# Patient Record
Sex: Female | Born: 1980
Health system: Southern US, Community
[De-identification: ages and names within clinical notes are randomized; demographics above are authoritative.]

## PROBLEM LIST (undated history)

## (undated) DIAGNOSIS — I10 Essential (primary) hypertension: Secondary | ICD-10-CM

---

## 2006-01-24 ENCOUNTER — Emergency Department: Payer: Self-pay

## 2015-07-08 ENCOUNTER — Emergency Department (HOSPITAL_BASED_OUTPATIENT_CLINIC_OR_DEPARTMENT_OTHER)
Admission: EM | Admit: 2015-07-08 | Discharge: 2015-07-08 | Disposition: A | Discharge status: Home / Self Care | Payer: Medicaid Other | Attending: Emergency Medicine | Admitting: Emergency Medicine

## 2015-07-08 ENCOUNTER — Emergency Department (HOSPITAL_BASED_OUTPATIENT_CLINIC_OR_DEPARTMENT_OTHER): Payer: Medicaid Other

## 2015-07-08 ENCOUNTER — Encounter (HOSPITAL_BASED_OUTPATIENT_CLINIC_OR_DEPARTMENT_OTHER): Payer: Self-pay

## 2015-07-08 DIAGNOSIS — N83201 Unspecified ovarian cyst, right side: Secondary | ICD-10-CM | POA: Diagnosis not present

## 2015-07-08 DIAGNOSIS — N390 Urinary tract infection, site not specified: Secondary | ICD-10-CM | POA: Diagnosis not present

## 2015-07-08 DIAGNOSIS — Z3202 Encounter for pregnancy test, result negative: Secondary | ICD-10-CM | POA: Diagnosis not present

## 2015-07-08 DIAGNOSIS — R42 Dizziness and giddiness: Secondary | ICD-10-CM | POA: Insufficient documentation

## 2015-07-08 DIAGNOSIS — R109 Unspecified abdominal pain: Secondary | ICD-10-CM

## 2015-07-08 DIAGNOSIS — R10A2 Flank pain, left side: Secondary | ICD-10-CM

## 2015-07-08 LAB — URINALYSIS, ROUTINE W REFLEX MICROSCOPIC
BILIRUBIN URINE: NEGATIVE
Glucose, UA: NEGATIVE mg/dL
Ketones, ur: NEGATIVE mg/dL
NITRITE: POSITIVE — AB
PROTEIN: NEGATIVE mg/dL
SPECIFIC GRAVITY, URINE: 1.028 (ref 1.005–1.030)
pH: 6.5 (ref 5.0–8.0)

## 2015-07-08 LAB — PREGNANCY, URINE: PREG TEST UR: NEGATIVE

## 2015-07-08 LAB — URINE MICROSCOPIC-ADD ON

## 2015-07-08 MED ORDER — CEPHALEXIN 500 MG PO CAPS: 500.0000 mg | ORAL_CAPSULE | Freq: Four times a day (QID) | ORAL | Status: DC

## 2015-07-08 MED ORDER — TRAMADOL HCL 50 MG PO TABS: 50.0000 mg | ORAL_TABLET | Freq: Four times a day (QID) | ORAL | Status: DC | PRN

## 2015-07-08 NOTE — ED Provider Notes (Signed)
CSN: [id number]     Arrival date & time 07/08/15  1643 History  By signing my name below, I, Budd Palmer, attest that this documentation has been prepared under the direction and in the presence of Geoffery Lyons, MD. Electronically Signed: Budd Palmer, ED Scribe. 07/08/2015. 5:33 PM.    Chief Complaint  Patient presents with  . Flank Pain   Patient is a 35 y.o. female presenting with flank pain. The history is provided by the patient. No language interpreter was used.  Flank Pain This is a new problem. The current episode started more than 2 days ago. The problem occurs constantly. The problem has been gradually worsening. The symptoms are aggravated by bending and twisting. The symptoms are relieved by rest.   HPI Comments: Cassidy Torres is a 35 y.o. female who presents to the Emergency Department complaining of constant, aching, worsening, left flank pain onset 4 days ago. She notes exacerbation of the pain to the point of lightheadedness when standing up, as well as exacerbation with movement, palpation, and twisting. She reports a PMHx of UTI, but notes that this felt different from her current symptoms. She reports her period is irregular, as she has an IUD. Pt denies dysuria, difficulty urinating, hematuria, fever, and loss of appetite.   History reviewed. No pertinent past medical history. History reviewed. No pertinent past surgical history. No family history on file. Social History  Substance Use Topics  . Smoking status: Never Smoker   . Smokeless tobacco: None  . Alcohol Use: No   OB History    No data available     Review of Systems  Constitutional: Negative for fever and appetite change.  Genitourinary: Positive for flank pain. Negative for dysuria, hematuria and difficulty urinating.  Neurological: Positive for light-headedness.  All other systems reviewed and are negative.   Allergies  Review of patient's allergies indicates no known allergies.  Home  Medications   Prior to Admission medications   Not on File   BP 132/93 mmHg  Pulse 96  Temp(Src) 98.4 F (36.9 C) (Oral)  Resp 18  Ht 4\' 11"  (1.499 m)  Wt 115 lb (52.164 kg)  BMI 23.21 kg/m2  SpO2 97% Physical Exam  Constitutional: She is oriented to person, place, and time. She appears well-developed and well-nourished.  HENT:  Head: Normocephalic and atraumatic.  Eyes: Conjunctivae are normal. Right eye exhibits no discharge. Left eye exhibits no discharge.  Pulmonary/Chest: Effort normal. No respiratory distress.  Musculoskeletal: She exhibits tenderness.  Mild TTP in the L flank  Neurological: She is alert and oriented to person, place, and time. Coordination normal.  Skin: Skin is warm and dry. No rash noted. She is not diaphoretic. No erythema.  Psychiatric: She has a normal mood and affect.  Nursing note and vitals reviewed.   ED Course  Procedures  DIAGNOSTIC STUDIES: Oxygen Saturation is 97% on RA, adequate by my interpretation.    COORDINATION OF CARE: 5:31 PM - Discussed lab results of mild infection and possible musculoskeletal origin of the pain. Discussed plans to order a CAT scan. Pt advised of plan for treatment and pt agrees.  Labs Review Labs Reviewed  URINALYSIS, ROUTINE W REFLEX MICROSCOPIC (NOT AT [facility]) - Abnormal; Notable for the following:    APPearance CLOUDY (*)    Hgb urine dipstick SMALL (*)    Nitrite POSITIVE (*)    Leukocytes, UA SMALL (*)    All other components within normal limits  URINE MICROSCOPIC-ADD ON - Abnormal; Notable  for the following:    Squamous Epithelial / LPF 6-30 (*)    Bacteria, UA MANY (*)    All other components within normal limits  PREGNANCY, URINE    Imaging Review Ct Renal Stone Study  07/08/2015  CLINICAL DATA:  35 year old female with left-sided flank pain for the past 4 days. No associated nausea, vomiting or diarrhea. EXAM: CT ABDOMEN AND PELVIS WITHOUT CONTRAST TECHNIQUE: Multidetector CT imaging of the  abdomen and pelvis was performed following the standard protocol without IV contrast. COMPARISON:  No priors. FINDINGS: Lower chest:  Unremarkable. Hepatobiliary: The definite cystic or solid hepatic lesions are identified on today's noncontrast CT examination. Unenhanced appearance of the gallbladder is normal. Pancreas: No definite pancreatic mass or peripancreatic inflammatory changes on today's noncontrast CT examination. Spleen: Unremarkable. Adrenals/Urinary Tract: There are no abnormal calcifications within the collecting system of either kidney, along the course of either ureter, or within the lumen of the urinary bladder. No hydroureteronephrosis or perinephric stranding to suggest urinary tract obstruction at this time. The unenhanced appearance of the kidneys is unremarkable bilaterally. Unenhanced appearance of the urinary bladder is normal. Bilateral adrenal glands are normal in appearance. Stomach/Bowel: Unenhanced appearance of the stomach is normal. No pathologic dilatation of small bowel or colon. Normal appendix. Vascular/Lymphatic: No significant atherosclerotic disease or definite aneurysm identified in the abdominal or pelvic vasculature. No lymphadenopathy noted in the abdomen or pelvis on today's noncontrast CT examination. Reproductive: IUD present in the uterus appears properly located. Left ovary is unremarkable in appearance. In the right ovary there is a well-circumscribed 4.5 x 4.2 x 5.1 cm low-attenuation lesion which is favored to represent a cyst, but is incompletely characterized on today's noncontrast CT examination. Other: Trace volume of free fluid in the cul-de-sac, likely physiologic in this young female patient. No pneumoperitoneum. Musculoskeletal: There are no aggressive appearing lytic or blastic lesions noted in the visualized portions of the skeleton. IMPRESSION: 1. No acute findings in the abdomen or pelvis to account for the patient's symptoms. Specifically, no urinary  tract calculi no findings of urinary tract obstruction are noted at this time. 2. 4.5 x 4.2 x 5.1 cm low-attenuation right ovarian lesion likely to represent a cyst. Follow-up pelvic ultrasound in 6-12 weeks is recommended to ensure stability or resolution. This recommendation follows ACR consensus guidelines: White Paper of the ACR Incidental Findings Committee II on Adnexal Findings. J Am Coll Radiol [id number]. 3. Normal appendix. 4. Additional incidental findings, as above. Electronically Signed   By: Trudie Reedaniel  Entrikin M.D.   On: 07/08/2015 18:34   I have personally reviewed and evaluated these images and lab results as part of my medical decision-making.   EKG Interpretation None      MDM   Final diagnoses:  Left flank pain    Patient presents here with complaints of left flank pain for the past several days. Her exam is most consistent with a musculoskeletal etiology, however her urinalysis does reveal slight blood and evidence for a slight infection. To rule out kidney stone, a renal CT was performed. This was negative for calculus, however did reveal an incidental right sided ovarian cyst. She will be treated with antibiotics, pain medication, and follow-up with her GYN if not improving.  I personally performed the services described in this documentation, which was scribed in my presence. The recorded information has been reviewed and is accurate.       Geoffery Lyonsouglas Evalena Fujii, MD 07/08/15 [phone]

## 2015-07-08 NOTE — Discharge Instructions (Signed)
Keflex as prescribed.   Tramadol as prescribed as needed for pain.  Follow-up with your Gynecologist in 3 months for a repeat ultrasound of your pelvis.   Flank Pain Flank pain refers to pain that is located on the side of the body between the upper abdomen and the back. The pain may occur over a short period of time (acute) or may be long-term or reoccurring (chronic). It may be mild or severe. Flank pain can be caused by many things. CAUSES  Some of the more common causes of flank pain include:  Muscle strains.   Muscle spasms.   A disease of your spine (vertebral disk disease).   A lung infection (pneumonia).   Fluid around your lungs (pulmonary edema).   A kidney infection.   Kidney stones.   A very painful skin rash caused by the chickenpox virus (shingles).   Gallbladder disease.  HOME CARE INSTRUCTIONS  Home care will depend on the cause of your pain. In general,  Rest as directed by your caregiver.  Drink enough fluids to keep your urine clear or pale yellow.  Only take over-the-counter or prescription medicines as directed by your caregiver. Some medicines may help relieve the pain.  Tell your caregiver about any changes in your pain.  Follow up with your caregiver as directed. SEEK IMMEDIATE MEDICAL CARE IF:   Your pain is not controlled with medicine.   You have new or worsening symptoms.  Your pain increases.   You have abdominal pain.   You have shortness of breath.   You have persistent nausea or vomiting.   You have swelling in your abdomen.   You feel faint or pass out.   You have blood in your urine.  You have a fever or persistent symptoms for more than 2-3 days.  You have a fever and your symptoms suddenly get worse. MAKE SURE YOU:   Understand these instructions.  Will watch your condition.  Will get help right away if you are not doing well or get worse.   This information is not intended to replace advice  given to you by your health care provider. Make sure you discuss any questions you have with your health care provider.   Document Released: 08/06/2005 Document Revised: 03/09/2012 Document Reviewed: 01/28/2012 Elsevier Interactive Patient Education [employer]2016 Elsevier Inc.

## 2015-07-08 NOTE — ED Notes (Addendum)
Left flank pain x 4 days-denies injury-pain is worse with movement

## 2015-07-31 ENCOUNTER — Emergency Department (HOSPITAL_BASED_OUTPATIENT_CLINIC_OR_DEPARTMENT_OTHER)
Admission: EM | Admit: 2015-07-31 | Discharge: 2015-07-31 | Disposition: A | Discharge status: Home / Self Care | Payer: Medicaid Other | Attending: Emergency Medicine | Admitting: Emergency Medicine

## 2015-07-31 ENCOUNTER — Encounter (HOSPITAL_BASED_OUTPATIENT_CLINIC_OR_DEPARTMENT_OTHER): Payer: Self-pay | Admitting: *Deleted

## 2015-07-31 ENCOUNTER — Emergency Department (HOSPITAL_BASED_OUTPATIENT_CLINIC_OR_DEPARTMENT_OTHER): Payer: Medicaid Other

## 2015-07-31 DIAGNOSIS — Z8669 Personal history of other diseases of the nervous system and sense organs: Secondary | ICD-10-CM | POA: Diagnosis not present

## 2015-07-31 DIAGNOSIS — R202 Paresthesia of skin: Secondary | ICD-10-CM | POA: Diagnosis not present

## 2015-07-31 DIAGNOSIS — R200 Anesthesia of skin: Secondary | ICD-10-CM | POA: Diagnosis not present

## 2015-07-31 DIAGNOSIS — M25522 Pain in left elbow: Secondary | ICD-10-CM | POA: Diagnosis present

## 2015-07-31 MED ORDER — IBUPROFEN 400 MG PO TABS
600.0000 mg | ORAL_TABLET | Freq: Once | ORAL | Status: AC
  Administered 2015-07-31: 600 mg via ORAL
  Filled 2015-07-31: qty 1

## 2015-07-31 MED ORDER — IBUPROFEN 600 MG PO TABS: 600.0000 mg | ORAL_TABLET | Freq: Four times a day (QID) | ORAL | Status: AC | PRN

## 2015-07-31 MED ORDER — HYDROCODONE-ACETAMINOPHEN 5-325 MG PO TABS: 1.0000 | ORAL_TABLET | ORAL | Status: DC | PRN

## 2015-07-31 MED FILL — HYDROCODON-APAP 5-325: 5-325 | 2 days supply | Qty: 10 | Fill #0

## 2015-07-31 MED FILL — IBUPROFEN 600 MG TABLET: 600 | 8 days supply | Qty: 30 | Fill #0

## 2015-07-31 NOTE — ED Notes (Signed)
Patient preparing for discharge. 

## 2015-07-31 NOTE — ED Notes (Signed)
States had pain in left arm after waking up on Monday. 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 C/o left elbow pain.

## 2015-07-31 NOTE — Discharge Instructions (Signed)

## 2015-07-31 NOTE — ED Provider Notes (Signed)
CSN: [id number]     Arrival date & time 07/31/15  0902 History   First MD Initiated Contact with Patient 07/31/15 [phone]     Chief Complaint  Patient presents with  . Elbow Pain     (Consider location/radiation/quality/duration/timing/severity/associated sxs/prior Treatment) HPI Patient presents with 2 days of left elbow pain. States she woke from sleeping with pain. Denies any known injury. States she has a long history of carpal tunnel syndrome. She continues to have paresthesias in the fingers of her left hand. She's been wearing a wrist splint. No fever or chills. Pain is worse with movement of the elbow and palpation. History reviewed. No pertinent past medical history. History reviewed. No pertinent past surgical history. No family history on file. Social History  Substance Use Topics  . Smoking status: Never Smoker   . Smokeless tobacco: None  . Alcohol Use: No   OB History    No data available     Review of Systems  Constitutional: Negative for fever and chills.  Musculoskeletal: Positive for arthralgias. Negative for myalgias, back pain and neck pain.  Skin: Negative for rash and wound.  Neurological: Positive for numbness. Negative for weakness.  All other systems reviewed and are negative.     Allergies  Review of patient's allergies indicates no known allergies.  Home Medications   Prior to Admission medications   Medication Sig Start Date End Date Taking? Authorizing Provider  traMADol (ULTRAM) 50 MG tablet Take 1 tablet (50 mg total) by mouth every 6 (six) hours as needed. 07/08/15  Yes Geoffery Lyons, MD  HYDROcodone-acetaminophen (NORCO) 5-325 MG tablet Take 1-2 tablets by mouth every 4 (four) hours as needed. 07/31/15   Loren Racer, MD  ibuprofen (ADVIL,MOTRIN) 600 MG tablet Take 1 tablet (600 mg total) by mouth every 6 (six) hours as needed. 07/31/15   Loren Racer, MD   BP 120/77 mmHg  Pulse 78  Temp(Src) 97.7 F (36.5 C) (Oral)  Resp 16  Ht   (1.499 m)  Wt 115 lb (52.164 kg)  BMI 23.21 kg/m2  SpO2 100% Physical Exam  Constitutional: She is oriented to person, place, and time. She appears well-developed and well-nourished. No distress.  HENT:  Head: Normocephalic and atraumatic.  Eyes: EOM are normal. Pupils are equal, round, and reactive to light.  Neck: Normal range of motion. Neck supple.  Cardiovascular: Normal rate and regular rhythm.   Pulmonary/Chest: Effort normal.  Abdominal: Soft.  Musculoskeletal: She exhibits tenderness. She exhibits no edema.  Difficult examination due to patient noncompliance with exam. She appears to have tenderness to palpation over the lateral epicondyle more so than the olecranon or medial epicondyles. There is no erythema, swelling or warmth. There is pain with range of motion. She plus distal pulses.  Neurological: She is alert and oriented to person, place, and time.  Decreased sensation to light touch over the palmar surface of the second and third digit of the left hand. 5/5 motor in grip strength and intrinsic muscles of the left hand.  Skin: Skin is warm and dry. No rash noted. No erythema.  Psychiatric: She has a normal mood and affect. Her behavior is normal.  Nursing note and vitals reviewed.   ED Course  Procedures (including critical care time) Labs Review Labs Reviewed - No data to display  Imaging Review Dg Elbow Complete Left  07/31/2015  CLINICAL DATA:  Left elbow pain for 2 days.  No known injury. EXAM: LEFT ELBOW - COMPLETE 3+ VIEW COMPARISON:  None. FINDINGS: The mineralization and alignment are normal. There is no evidence of acute fracture or dislocation. The joint spaces are maintained. There is no elbow joint effusion. On the oblique view, there is some soft tissue calcification proximal to the olecranon process which could reflect hydroxyapatite deposition. No osseous erosion identified. IMPRESSION: Possible hydroxyapatite deposition adjacent to the olecranon process as  can be seen with calcific bursitis. No acute osseous findings or joint effusion. Electronically Signed   By: Carey Bullocks M.D.   On: 07/31/2015 09:46   I have personally reviewed and evaluated these images and lab results as part of my medical decision-making.   EKG Interpretation None      MDM   Final diagnoses:  Left elbow pain    Calcification in the soft tissue that can be seen with calcific bursitis. Also may have lateral epicondylitis.   We'll treat with anti-inflammatory and sling. Will refer to sports medicine. Low suspicion for infectious process such as septic joint. Return precautions have been given.    Loren Racer, MD 08/01/15 [phone]

## 2015-11-16 ENCOUNTER — Encounter (HOSPITAL_BASED_OUTPATIENT_CLINIC_OR_DEPARTMENT_OTHER): Payer: Self-pay

## 2015-11-16 ENCOUNTER — Emergency Department (HOSPITAL_BASED_OUTPATIENT_CLINIC_OR_DEPARTMENT_OTHER)
Admission: EM | Admit: 2015-11-16 | Discharge: 2015-11-16 | Disposition: A | Discharge status: Home / Self Care | Payer: Medicaid Other | Attending: Emergency Medicine | Admitting: Emergency Medicine

## 2015-11-16 DIAGNOSIS — J989 Respiratory disorder, unspecified: Secondary | ICD-10-CM | POA: Diagnosis not present

## 2015-11-16 DIAGNOSIS — J988 Other specified respiratory disorders: Secondary | ICD-10-CM

## 2015-11-16 DIAGNOSIS — R509 Fever, unspecified: Secondary | ICD-10-CM | POA: Diagnosis present

## 2015-11-16 DIAGNOSIS — B9789 Other viral agents as the cause of diseases classified elsewhere: Secondary | ICD-10-CM

## 2015-11-16 LAB — RAPID STREP SCREEN (MED CTR MEBANE ONLY): STREPTOCOCCUS, GROUP A SCREEN (DIRECT): NEGATIVE

## 2015-11-16 MED ORDER — NAPROXEN 250 MG PO TABS
500.0000 mg | ORAL_TABLET | Freq: Once | ORAL | Status: AC
  Administered 2015-11-16: 500 mg via ORAL
  Filled 2015-11-16: qty 2

## 2015-11-16 MED ORDER — NAPROXEN 500 MG PO TABS: ORAL_TABLET | ORAL | Status: DC

## 2015-11-16 NOTE — ED Provider Notes (Signed)
CSN: [id number]     Arrival date & time 11/16/15  0007 History   First MD Initiated Contact with Patient 11/16/15 0421     Chief Complaint  Patient presents with  . Fever     (Consider location/radiation/quality/duration/timing/severity/associated sxs/prior Treatment) HPI  A 35 year old female with flulike symptoms since yesterday evening. She has had subjective fever, chills, sore throat, nasal burning, ear pressure and generalized body aches. She denies cough, shortness of breath, nasal congestion, nausea, vomiting or diarrhea. She has taken Tylenol and Claritin without significant relief.  History reviewed. No pertinent past medical history. History reviewed. No pertinent past surgical history. No family history on file. Social History  Substance Use Topics  . Smoking status: Never Smoker   . Smokeless tobacco: None  . Alcohol Use: No   OB History    No data available     Review of Systems  All other systems reviewed and are negative.   Allergies  Review of patient's allergies indicates no known allergies.  Home Medications   Prior to Admission medications   Medication Sig Start Date End Date Taking? Authorizing Provider  HYDROcodone-acetaminophen (NORCO) 5-325 MG tablet Take 1-2 tablets by mouth every 4 (four) hours as needed. 07/31/15   Loren Raceravid Yelverton, MD  ibuprofen (ADVIL,MOTRIN) 600 MG tablet Take 1 tablet (600 mg total) by mouth every 6 (six) hours as needed. 07/31/15   Loren Raceravid Yelverton, MD  traMADol (ULTRAM) 50 MG tablet Take 1 tablet (50 mg total) by mouth every 6 (six) hours as needed. 07/08/15   Geoffery Lyonsouglas Delo, MD   BP 118/81 mmHg  Pulse 108  Temp(Src) 99.3 F (37.4 C) (Oral)  Resp 20  Ht 4\' 11"  (1.499 m)  Wt 117 lb (53.071 kg)  BMI 23.62 kg/m2  SpO2 100%   Physical Exam  General: Well-developed, well-nourished female in no acute distress; appearance consistent with age of record HENT: normocephalic; atraumatic; tonsillar enlargement Eyes: pupils equal, round  and reactive to light; extraocular muscles intact Neck: supple Heart: regular rate and rhythm Lungs: clear to auscultation bilaterally Abdomen: soft; nondistended; nontender; bowel sounds present Extremities: No deformity; full range of motion; pulses normal Neurologic: Awake, alert and oriented; motor function intact in all extremities and symmetric; no facial droop Skin: Warm and dry Psychiatric: Normal mood and affect    ED Course  Procedures (including critical care time)   MDM  Nursing notes and vitals signs, including pulse oximetry, reviewed.  Summary of this visit's results, reviewed by myself:  Labs:  Results for orders placed or performed during the hospital encounter of 11/16/15 (from the past 24 hour(s))  Rapid strep screen     Status: None   Collection Time: 11/16/15  4:50 AM  Result Value Ref Range   Streptococcus, Group A Screen (Direct) NEGATIVE NEGATIVE      Paula LibraJohn Huxley Vanwagoner, MD 11/16/15 [id number]

## 2015-11-16 NOTE — ED Notes (Signed)
Pt reports waking up with chills and body aches. Sts taking claritin and tylenol 30 mins pta. No cough.

## 2015-11-16 NOTE — Discharge Instructions (Signed)
Viral Infections °A viral infection can be caused by different types of viruses. Most viral infections are not serious and resolve on their own. However, some infections may cause severe symptoms and may lead to further complications. °SYMPTOMS °Viruses can frequently cause: °· Minor sore throat. °· Aches and pains. °· Headaches. °· Runny nose. °· Different types of rashes. °· Watery eyes. °· Tiredness. °· Cough. °· Loss of appetite. °· Gastrointestinal infections, resulting in nausea, vomiting, and diarrhea. °These symptoms do not respond to antibiotics because the infection is not caused by bacteria. However, you might catch a bacterial infection following the viral infection. This is sometimes called a "superinfection." Symptoms of such a bacterial infection may include: °· Worsening sore throat with pus and difficulty swallowing. °· Swollen neck glands. °· Chills and a high or persistent fever. °· Severe headache. °· Tenderness over the sinuses. °· Persistent overall ill feeling (malaise), muscle aches, and tiredness (fatigue). °· Persistent cough. °· Yellow, green, or brown mucus production with coughing. °HOME CARE INSTRUCTIONS  °· Only take over-the-counter or prescription medicines for pain, discomfort, diarrhea, or fever as directed by your caregiver. °· Drink enough water and fluids to keep your urine clear or pale yellow. Sports drinks can provide valuable electrolytes, sugars, and hydration. °· Get plenty of rest and maintain proper nutrition. Soups and broths with crackers or rice are fine. °SEEK IMMEDIATE MEDICAL CARE IF:  °· You have severe headaches, shortness of breath, chest pain, neck pain, or an unusual rash. °· You have uncontrolled vomiting, diarrhea, or you are unable to keep down fluids. °· You or your child has an oral temperature above 102° F (38.9° C), not controlled by medicine. °· Your baby is older than 3 months with a rectal temperature of 102° F (38.9° C) or higher. °· Your baby is 3  months old or younger with a rectal temperature of 100.4° F (38° C) or higher. °MAKE SURE YOU:  °· Understand these instructions. °· Will watch your condition. °· Will get help right away if you are not doing well or get worse. °  °This information is not intended to replace advice given to you by your health care provider. Make sure you discuss any questions you have with your health care provider. °  °Document Released: 03/25/2005 Document Revised: 09/07/2011 Document Reviewed: 11/21/2014 °Elsevier Interactive Patient Education ©2016 Elsevier Inc. ° °

## 2015-11-16 NOTE — ED Notes (Signed)
Pt verbalizes understanding of d/c instructions and denies any further needs at this time. 

## 2015-11-18 LAB — CULTURE, GROUP A STREP (THRC)

## 2016-03-15 ENCOUNTER — Encounter (HOSPITAL_BASED_OUTPATIENT_CLINIC_OR_DEPARTMENT_OTHER): Payer: Self-pay | Admitting: *Deleted

## 2016-03-15 ENCOUNTER — Emergency Department (HOSPITAL_BASED_OUTPATIENT_CLINIC_OR_DEPARTMENT_OTHER)
Admission: EM | Admit: 2016-03-15 | Discharge: 2016-03-15 | Disposition: A | Discharge status: Home / Self Care | Payer: Medicaid Other | Attending: Emergency Medicine | Admitting: Emergency Medicine

## 2016-03-15 DIAGNOSIS — J029 Acute pharyngitis, unspecified: Secondary | ICD-10-CM

## 2016-03-15 DIAGNOSIS — Z79899 Other long term (current) drug therapy: Secondary | ICD-10-CM | POA: Insufficient documentation

## 2016-03-15 DIAGNOSIS — J039 Acute tonsillitis, unspecified: Secondary | ICD-10-CM

## 2016-03-15 LAB — RAPID STREP SCREEN (MED CTR MEBANE ONLY): STREPTOCOCCUS, GROUP A SCREEN (DIRECT): NEGATIVE

## 2016-03-15 MED ORDER — DEXAMETHASONE SODIUM PHOSPHATE 10 MG/ML IJ SOLN
10.0000 mg | Freq: Once | INTRAMUSCULAR | Status: AC
  Administered 2016-03-15: 10 mg via INTRAVENOUS
  Filled 2016-03-15: qty 1

## 2016-03-15 MED ORDER — SODIUM CHLORIDE 0.9 % IV BOLUS (SEPSIS)
1000.0000 mL | Freq: Once | INTRAVENOUS | Status: AC
  Administered 2016-03-15: 1000 mL via INTRAVENOUS

## 2016-03-15 MED ORDER — ACETAMINOPHEN 325 MG PO TABS
650.0000 mg | ORAL_TABLET | Freq: Once | ORAL | Status: AC | PRN
  Administered 2016-03-15: 650 mg via ORAL
  Filled 2016-03-15: qty 2

## 2016-03-15 MED ORDER — IBUPROFEN 600 MG PO TABS: 600.0000 mg | ORAL_TABLET | Freq: Four times a day (QID) | ORAL | 0 refills | Status: AC | PRN

## 2016-03-15 MED ORDER — BENZOCAINE 20 % MT SOLN: OROMUCOSAL | 0 refills | Status: AC

## 2016-03-15 MED ORDER — KETOROLAC TROMETHAMINE 30 MG/ML IJ SOLN
30.0000 mg | Freq: Once | INTRAMUSCULAR | Status: AC
  Administered 2016-03-15: 30 mg via INTRAVENOUS
  Filled 2016-03-15: qty 1

## 2016-03-15 NOTE — Discharge Instructions (Signed)
Follow up with ENT as soon as possible. Return to the ER for new or worsening symptoms.

## 2016-03-15 NOTE — ED Provider Notes (Signed)
[facility] Provider Note   CSN: [id number] Arrival date & time: 03/15/16  1829 By signing my name below, I, Levon Hedger, attest that this documentation has been prepared under the direction and in the presence of non-physician practitioner, Noelle Penner, PA-C  Electronically Signed: Levon Hedger, Scribe. 03/15/2016. 7:40 PM.   History   Chief Complaint Chief Complaint  Patient presents with  . Sore Throat   HPI Cassidy Torres is a 35 y.o. female who presents to the Emergency Department complaining of sore throat onset 3-4 days ago. Pain is worsened with swallowing. She notes associated chills, fever, ear pain, and generalized body aches.  She has taken tylenol, Theraflu, and naprosyn with no relief. Pt denies any abdominal pain.  The history is provided by the patient. No language interpreter was used.   History reviewed. No pertinent past medical history.  There are no active problems to display for this patient.   History reviewed. No pertinent surgical history.  OB History    No data available      Home Medications    Prior to Admission medications   Medication Sig Start Date End Date Taking? Authorizing Provider  HYDROcodone-acetaminophen (NORCO) 5-325 MG tablet Take 1-2 tablets by mouth every 4 (four) hours as needed. 07/31/15   Loren Racer, MD  ibuprofen (ADVIL,MOTRIN) 600 MG tablet Take 1 tablet (600 mg total) by mouth every 6 (six) hours as needed. 07/31/15   Loren Racer, MD  naproxen (NAPROSYN) 500 MG tablet Take one tablet with food every 12 hours as needed for body aches or fever. 11/16/15   John Molpus, MD  traMADol (ULTRAM) 50 MG tablet Take 1 tablet (50 mg total) by mouth every 6 (six) hours as needed. 07/08/15   Geoffery Lyons, MD    Family History History reviewed. No pertinent family history.  Social History Social History  Substance Use Topics  . Smoking status: Never Smoker  . Smokeless tobacco: Never Used  . Alcohol use No     Allergies   Review of patient's allergies indicates no known allergies.  Review of Systems Review of Systems  Constitutional: Positive for chills and fever.  HENT: Positive for ear pain and sore throat.   Gastrointestinal: Negative for abdominal pain.  Neurological: Positive for weakness.  All other systems reviewed and are negative.  Physical Exam Updated Vital Signs BP 123/82 (BP Location: Right Arm)   Pulse (!) 123   Temp 101.8 F (38.8 C) (Oral)   Resp 20   Ht 4\' 11"  (1.499 m)   Wt 116 lb (52.6 kg)   LMP 03/04/2016 (Exact Date)   SpO2 100%   BMI 23.43 kg/m   Physical Exam  Constitutional: She is oriented to person, place, and time. She appears ill.  HENT:  Head: Normocephalic and atraumatic.  Bilateral tonsillar enlargement with exudate. Uvula midline. No posterior oropharyngeal edema or evidence of PTA/RTA.  Eyes: Conjunctivae and EOM are normal. Pupils are equal, round, and reactive to light.  Neck: Normal range of motion. Neck supple.  No meningismus.  Cardiovascular: Normal rate, regular rhythm, normal heart sounds and intact distal pulses.   No murmur heard. Pulmonary/Chest: Effort normal and breath sounds normal. No respiratory distress. She has no wheezes.  Abdominal: Soft. There is no tenderness. There is no rebound and no guarding.  Musculoskeletal: Normal range of motion. She exhibits no edema or tenderness.  Neurological: She is alert and oriented to person, place, and time. No cranial nerve deficit. She exhibits normal muscle  tone. Coordination normal.  Speech clear without dysarthria.  5/5 strength throughout.   Skin: Skin is warm.  Psychiatric: She has a normal mood and affect. Her behavior is normal.  Nursing note and vitals reviewed.  Vitals:   03/15/16 1848 03/15/16 2111 03/15/16 2156 03/15/16 2202  BP:  106/74  109/78  Pulse:  104  108  Resp:  18  20  Temp:  99.2 F (37.3 C) 99 F (37.2 C)   TempSrc:  Oral Oral   SpO2:  98%  99%   Weight: 52.6 kg     Height: 4\' 11"  (1.499 m)        ED Treatments / Results  DIAGNOSTIC STUDIES:  Oxygen Saturation is 100% on RA, normal by my interpretation.    Labs (all labs ordered are listed, but only abnormal results are displayed) Labs Reviewed  RAPID STREP SCREEN (NOT [facility])  CULTURE, GROUP A STREP [facility])    EKG  EKG Interpretation None       Radiology No results found.  Procedures Procedures (including critical care time)  Medications Ordered in ED Medications  sodium chloride 0.9 % bolus 1,000 mL (not administered)  ketorolac (TORADOL) 30 MG/ML injection 30 mg (not administered)  acetaminophen (TYLENOL) tablet 650 mg (650 mg Oral Given 03/15/16 1857)     Initial Impression / Assessment and Plan / ED Course  I have reviewed the triage vital signs and the nursing notes.  Pertinent labs & imaging results that were available during my care of the patient were reviewed by me and considered in my medical decision making (see chart for details).  Clinical Course    Rapid strep ordered in triage is negative. Will send for GAS culture. Fever and tachycardia improved with fluids and meds. Pt feeling much improved. Discussed likely viral tonsillitis. Encouraged close ENT f/u. Ibuprofen as needed for fever and pain. Decadron given in the ED. Pt nontoxic appearing, tolerating secretions, patent airway. ER return precautions given.  Final Clinical Impressions(s) / ED Diagnoses   Final diagnoses:  Tonsillitis  Pharyngitis   I personally performed the services described in this documentation, which was scribed in my presence. The recorded information has been reviewed and is accurate.  New Prescriptions Discharge Medication List as of 03/15/2016 10:27 PM    START taking these medications   Details  benzocaine (BENZOCAINE ORAL ANESTHETIC) 20 % SOLN Swish, gargle, and spit twice daily as needed for sore throat, Print    !! ibuprofen (ADVIL,MOTRIN) 600 MG  tablet Take 1 tablet (600 mg total) by mouth every 6 (six) hours as needed., Starting Sun 03/15/2016, Print     !! - Potential duplicate medications found. Please discuss with provider.       Carlene CoriaSerena Y Kaito Schulenburg, PA-C 03/16/16 [id number]    Melene Planan Floyd, DO 03/18/16 1512

## 2016-03-15 NOTE — ED Triage Notes (Signed)
Sore throat x 3-4 days. No relief with OTC meds. Unable to swallow. "Feel like I'm going to pass out."

## 2016-03-15 NOTE — ED Notes (Signed)
Pt given d/c instructions as per chart. Verbalizes understanding. No questions. Rx x 2. 

## 2016-03-18 LAB — CULTURE, GROUP A STREP (THRC)

## 2018-01-09 ENCOUNTER — Encounter (HOSPITAL_BASED_OUTPATIENT_CLINIC_OR_DEPARTMENT_OTHER): Payer: Self-pay | Admitting: Emergency Medicine

## 2018-01-09 ENCOUNTER — Emergency Department (HOSPITAL_BASED_OUTPATIENT_CLINIC_OR_DEPARTMENT_OTHER)
Admission: EM | Admit: 2018-01-09 | Discharge: 2018-01-09 | Disposition: A | Discharge status: Home / Self Care | Payer: Medicaid Other | Attending: Emergency Medicine | Admitting: Emergency Medicine

## 2018-01-09 ENCOUNTER — Other Ambulatory Visit: Payer: Self-pay

## 2018-01-09 ENCOUNTER — Emergency Department (HOSPITAL_BASED_OUTPATIENT_CLINIC_OR_DEPARTMENT_OTHER): Payer: Medicaid Other

## 2018-01-09 DIAGNOSIS — Z79899 Other long term (current) drug therapy: Secondary | ICD-10-CM | POA: Diagnosis not present

## 2018-01-09 DIAGNOSIS — Y998 Other external cause status: Secondary | ICD-10-CM | POA: Insufficient documentation

## 2018-01-09 DIAGNOSIS — X500XXA Overexertion from strenuous movement or load, initial encounter: Secondary | ICD-10-CM | POA: Insufficient documentation

## 2018-01-09 DIAGNOSIS — M545 Low back pain: Secondary | ICD-10-CM

## 2018-01-09 DIAGNOSIS — Y929 Unspecified place or not applicable: Secondary | ICD-10-CM | POA: Diagnosis not present

## 2018-01-09 DIAGNOSIS — Y9389 Activity, other specified: Secondary | ICD-10-CM | POA: Diagnosis not present

## 2018-01-09 DIAGNOSIS — S39012A Strain of muscle, fascia and tendon of lower back, initial encounter: Secondary | ICD-10-CM | POA: Insufficient documentation

## 2018-01-09 DIAGNOSIS — T148XXA Other injury of unspecified body region, initial encounter: Secondary | ICD-10-CM

## 2018-01-09 DIAGNOSIS — S3992XA Unspecified injury of lower back, initial encounter: Secondary | ICD-10-CM | POA: Diagnosis present

## 2018-01-09 LAB — PREGNANCY, URINE: Preg Test, Ur: NEGATIVE

## 2018-01-09 MED ORDER — NAPROXEN 375 MG PO TABS: 375.0000 mg | ORAL_TABLET | Freq: Two times a day (BID) | ORAL | 0 refills | Status: DC

## 2018-01-09 MED ORDER — DIAZEPAM 5 MG/ML IJ SOLN
5.0000 mg | Freq: Once | INTRAMUSCULAR | Status: AC
  Administered 2018-01-09: 5 mg via INTRAMUSCULAR
  Filled 2018-01-09: qty 2

## 2018-01-09 MED ORDER — METHOCARBAMOL 500 MG PO TABS: 500.0000 mg | ORAL_TABLET | Freq: Two times a day (BID) | ORAL | 0 refills | Status: AC

## 2018-01-09 MED ORDER — HYDROCODONE-ACETAMINOPHEN 5-325 MG PO TABS
1.0000 | ORAL_TABLET | Freq: Once | ORAL | Status: AC
  Administered 2018-01-09: 1 via ORAL
  Filled 2018-01-09: qty 1

## 2018-01-09 NOTE — Discharge Instructions (Signed)
You can take Tylenol or Ibuprofen as directed for pain. You can alternate Tylenol and Ibuprofen every 4 hours. If you take Tylenol at 1pm, then you can take Ibuprofen at 5pm. Then you can take Tylenol again at 9pm.   If Tylenol and ibuprofen is not ready, you can use the naproxen.  Do not take them at the same time.  Take Robaxin as prescribed. This medication will make you drowsy so do not drive or drink alcohol when taking it.  You can apply warm compresses to the area to help with muscle strain.  Make sure you are doing range of motion exercises to help with muscle tension.  Return to the Emergency Department immediately for any worsening back pain, neck pain, difficulty walking, numbness/weaknss of your arms or legs, urinary or bowel accidents, fever or any other worsening or concerning symptoms.

## 2018-01-09 NOTE — ED Provider Notes (Signed)
[facility] EMERGENCY DEPARTMENT Provider Note   CSN: [id number] Arrival date & time: 01/09/18  1530     History   Chief Complaint Chief Complaint  Patient presents with  . Back Pain    HPI Cassidy Torres is a 37 y.o. female who presents for evaluation of lower back pain x5 days.  She denies any preceding trauma, injury, fall.  She does report that she has an 57-month-old baby that she picks up and carries frequently.  She says that she will sometimes have lower back pain.  Patient states that she has taken Doan's medicine and ibuprofen with minimal improvement in symptoms.  She is still been able to ambulate.  She reports that the pain is worse with bending or standing up straight.  Patient reports she has also used icy hot with minimal improvement. Denies fevers, weight loss, numbness/weakness of upper and lower extremities, bowel/bladder incontinence, saddle anesthesia, history of back surgery, history of IVDA.   The history is provided by the patient.    History reviewed. No pertinent past medical history.  There are no active problems to display for this patient.   History reviewed. No pertinent surgical history.   OB History   None      Home Medications    Prior to Admission medications   Medication Sig Start Date End Date Taking? Authorizing Provider  metroNIDAZOLE (FLAGYL) 500 MG tablet Take 500 mg by mouth 2 (two) times daily.   Yes [provider]  benzocaine (BENZOCAINE ORAL ANESTHETIC) 20 % SOLN Swish, gargle, and spit twice daily as needed for sore throat 03/15/16   Sam, Ace Gins, PA-C  HYDROcodone-acetaminophen (NORCO) 5-325 MG tablet Take 1-2 tablets by mouth every 4 (four) hours as needed. 07/31/15   Loren Racer, MD  ibuprofen (ADVIL,MOTRIN) 600 MG tablet Take 1 tablet (600 mg total) by mouth every 6 (six) hours as needed. 07/31/15   Loren Racer, MD  ibuprofen (ADVIL,MOTRIN) 600 MG tablet Take 1 tablet (600 mg total) by mouth every 6  (six) hours as needed. 03/15/16   Sam, Ace Gins, PA-C  methocarbamol (ROBAXIN) 500 MG tablet Take 1 tablet (500 mg total) by mouth 2 (two) times daily. 01/09/18   Maxwell Caul, PA-C  naproxen (NAPROSYN) 375 MG tablet Take 1 tablet (375 mg total) by mouth 2 (two) times daily. 01/09/18   Maxwell Caul, PA-C  traMADol (ULTRAM) 50 MG tablet Take 1 tablet (50 mg total) by mouth every 6 (six) hours as needed. 07/08/15   Geoffery Lyons, MD    Family History History reviewed. No pertinent family history.  Social History Social History   Tobacco Use  . Smoking status: Never Smoker  . Smokeless tobacco: Never Used  Substance Use Topics  . Alcohol use: No  . Drug use: No     Allergies   Patient has no known allergies.   Review of Systems Review of Systems  Constitutional: Negative for fever.  Musculoskeletal: Positive for back pain.  Neurological: Negative for weakness and numbness.     Physical Exam Updated Vital Signs BP (!) 133/102 (BP Location: Left Arm)   Pulse 68   Temp 98.3 F (36.8 C) (Oral)   Resp 19   Ht 5' (1.524 m)   Wt 63.5 kg (140 lb)   SpO2 100%   BMI 27.34 kg/m   Physical Exam  Constitutional: She appears well-developed and well-nourished.  HENT:  Head: Normocephalic and atraumatic.  Eyes: Conjunctivae and EOM are normal. Right eye  exhibits no discharge. Left eye exhibits no discharge. No scleral icterus.  Neck: Full passive range of motion without pain.  Full flexion/extension and lateral movement of neck fully intact. No bony midline tenderness. No deformities or crepitus.   Pulmonary/Chest: Effort normal.  Musculoskeletal:       Back:  She is muscular tenderness overlying the paraspinal muscles of the lumbar region with obvious spasm that extends into the midline and extends up to midline thoracic spine.  No deformity or crepitus noted.  No step-offs.  Limited flexion/extension secondary to patient's pain.  Neurological: She is alert.  Follows  commands, Moves all extremities  5/5 strength to BUE and BLE  Sensation intact throughout all major nerve distributions  Skin: Skin is warm and dry.  Psychiatric: She has a normal mood and affect. Her speech is normal and behavior is normal.  Nursing note and vitals reviewed.    ED Treatments / Results  Labs (all labs ordered are listed, but only abnormal results are displayed) Labs Reviewed  PREGNANCY, URINE    EKG None  Radiology Dg Thoracic Spine 2 View  Result Date: 01/09/2018 CLINICAL DATA:  Right-sided back pain over the last 6 days. EXAM: THORACIC SPINE 2 VIEWS COMPARISON:  None. FINDINGS: There may be diminutive cervical ribs. There are 12 rib-bearing thoracic vertebral bodies. Alignment shows very minimal curvature convex to the right, not significant. Alignment in the lateral projection is normal. No fracture or disc space narrowing. No evidence of facet arthropathy. IMPRESSION: Minimal curvature convex to the right, probably not significant. No traumatic or degenerative changes. Electronically Signed   By: Paulina Fusi M.D.   On: 01/09/2018 17:01   Dg Lumbar Spine Complete  Result Date: 01/09/2018 CLINICAL DATA:  Right-sided back pain, 6 days duration. EXAM: LUMBAR SPINE - COMPLETE 4+ VIEW COMPARISON:  CT 07/08/2015 FINDINGS: There are 4 non rib-bearing vertebral bodies. Alignment is normal. No disc space narrowing. No evidence of facet arthropathy or pars defect. IMPRESSION: No degenerative or traumatic finding. No abnormality seen to explain pain. Electronically Signed   By: Paulina Fusi M.D.   On: 01/09/2018 17:00    Procedures Procedures (including critical care time)  Medications Ordered in ED Medications  diazepam (VALIUM) injection 5 mg (has no administration in time range)  HYDROcodone-acetaminophen (NORCO/VICODIN) 5-325 MG per tablet 1 tablet (has no administration in time range)     Initial Impression / Assessment and Plan / ED Course  I have reviewed the  triage vital signs and the nursing notes.  Pertinent labs & imaging results that were available during my care of the patient were reviewed by me and considered in my medical decision making (see chart for details).     37 y.o. female who presents for evaluation of lower back pain x5 days.  Reports that she does lift a baby up frequently.  She denies any preceding trauma or injury.  Has tried over-the-counter medications no improvement.  No red flags, neuro deficits noted. Patient is afebrile, non-toxic appearing, sitting comfortably on examination table. Vital signs reviewed and stable.  On exam, patient does have diffuse muscular tenderness overlying the entire lumbar region with overlying spasm.  Consider muscle strain versus mechanical back pain.  History/physical exam is not concerning for cauda equina or spinal abscess.  History/physical exam is not concerning for aortic dissection.  XRs reviewed.  Negative for any acute fracture dislocation.  Discussed results with patient.  She is ambulating here in the department.  Advised that I cannot  give any strong pain medications here in the ED as she does not have a ride home.  Patient states she is, call family member to come get her.  We will plan for supportive at home therapies. Patient had ample opportunity for questions and discussion. All patient's questions were answered with full understanding. Strict return precautions discussed. Patient expresses understanding and agreement to plan.   Final Clinical Impressions(s) / ED Diagnoses   Final diagnoses:  Acute bilateral low back pain, with sciatica presence unspecified  Muscle strain    ED Discharge Orders        Ordered    naproxen (NAPROSYN) 375 MG tablet  2 times daily     01/09/18 1720    methocarbamol (ROBAXIN) 500 MG tablet  2 times daily     01/09/18 1720       Maxwell CaulLayden, Zealand Boyett A, PA-C 01/09/18 1729    Vanetta MuldersZackowski, Scott, MD 01/09/18 2337

## 2018-01-09 NOTE — ED Triage Notes (Signed)
patient states that she has had hx of back pain - the patient reports that she has pain to her lower back since tues

## 2019-08-18 IMAGING — DX DG THORACIC SPINE 2V
3 series · 3 of 3 positions shown · non-contrast
Comparison: None.

CLINICAL DATA: Right-sided back pain over the last 6 days.

EXAM:
THORACIC SPINE 2 VIEWS

[t-spine ap]
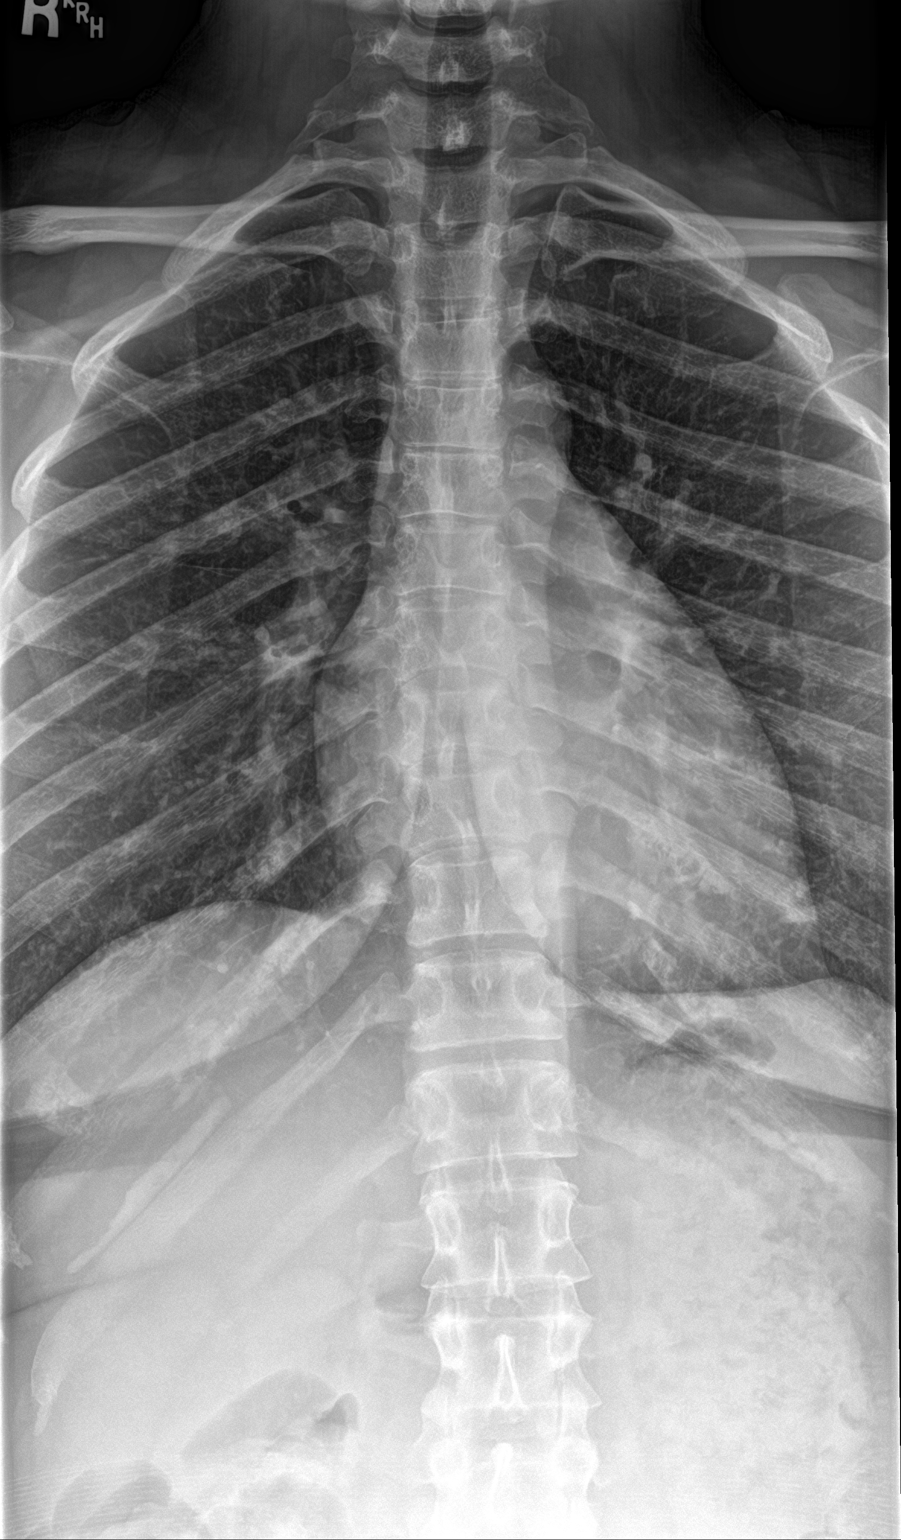

[t-spine lat]
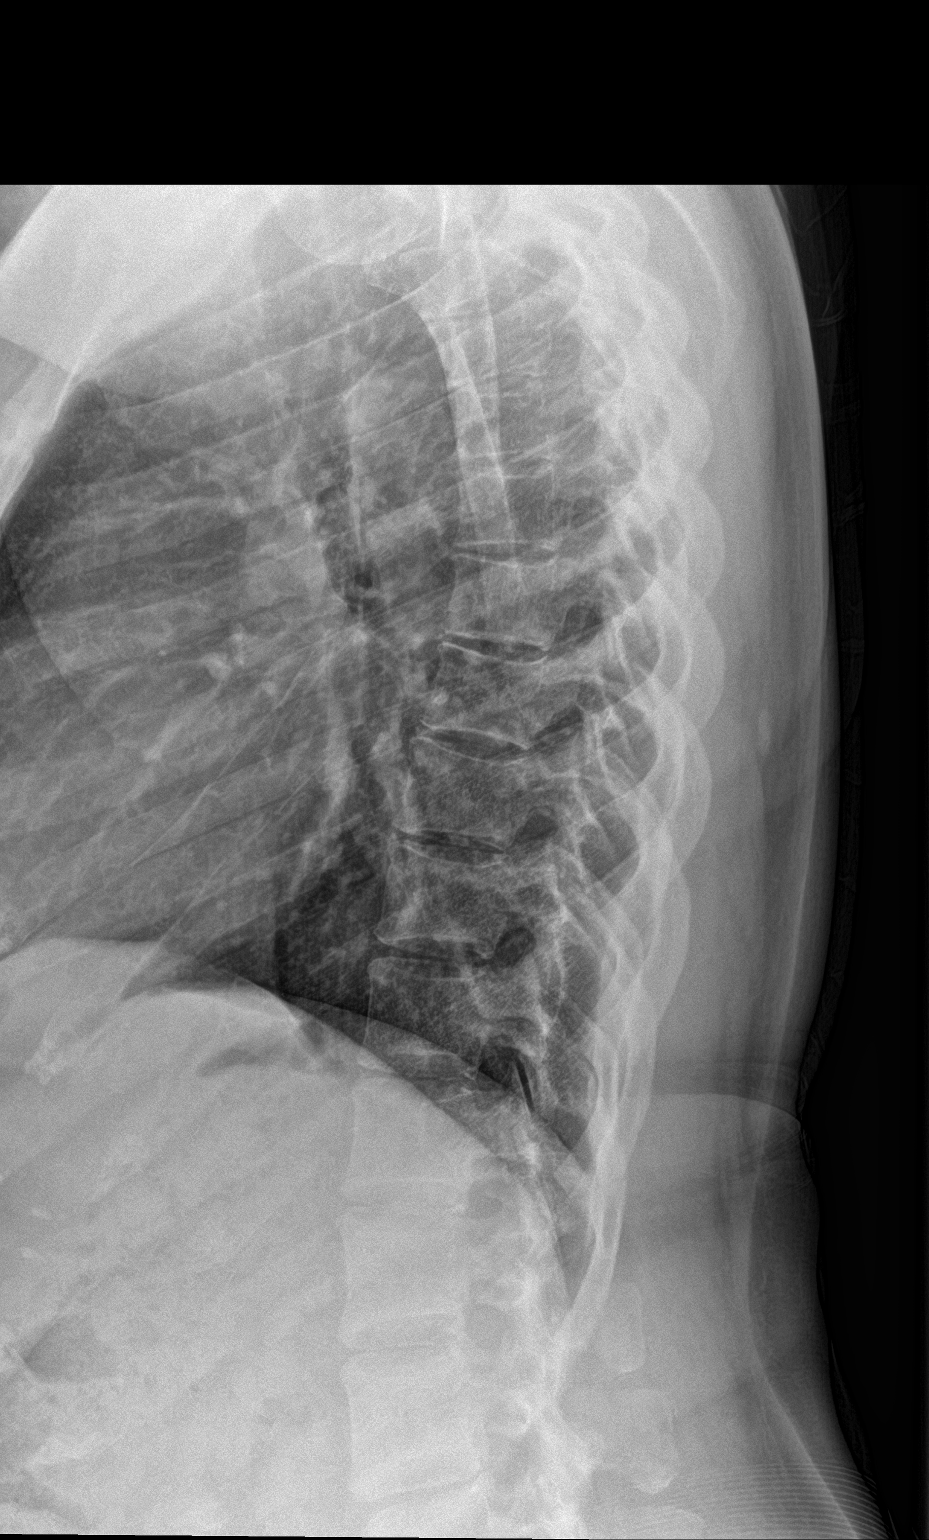

[t-spine swimmers]
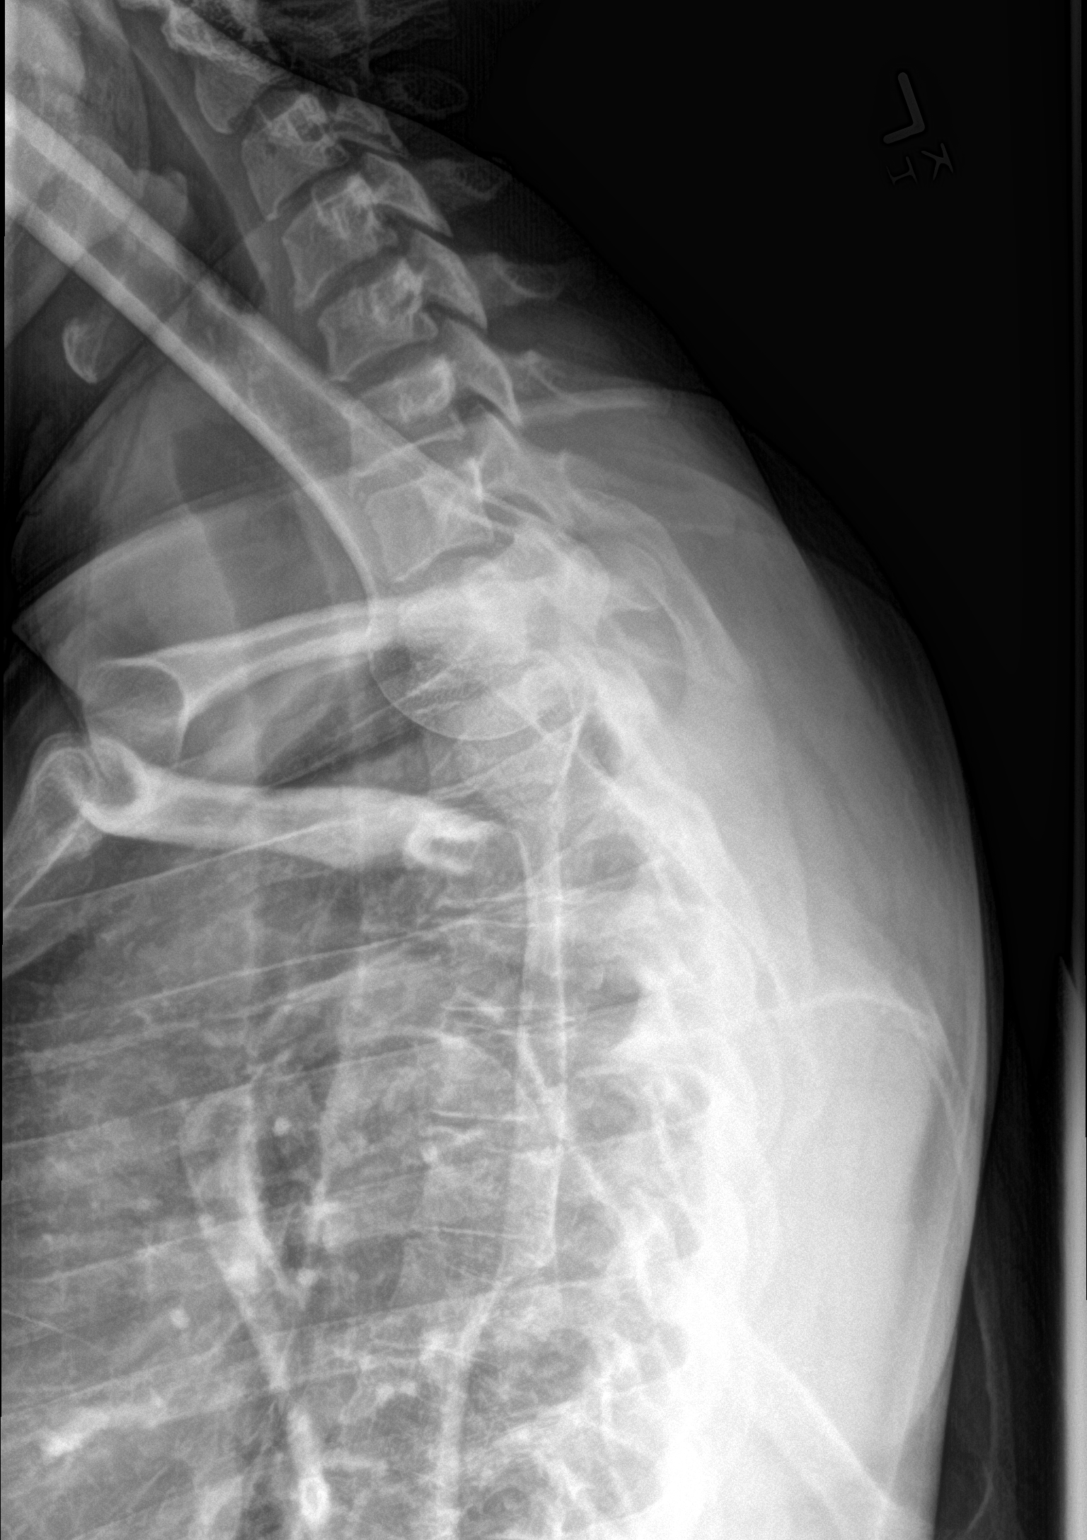

[3 of 3 positions shown; findings below may reference images not displayed]

FINDINGS: There may be diminutive cervical ribs. There are 12 rib-bearing
thoracic vertebral bodies. Alignment shows very minimal curvature
convex to the right, not significant. Alignment in the lateral
projection is normal. No fracture or disc space narrowing. No
evidence of facet arthropathy.
IMPRESSION: Minimal curvature convex to the right, probably not significant. No
traumatic or degenerative changes.

## 2019-08-18 IMAGING — DX DG LUMBAR SPINE COMPLETE 4+V
5 series · 5 of 5 positions shown · non-contrast
Comparison: CT 07/08/2015

CLINICAL DATA: Right-sided back pain, 6 days duration.

EXAM:
LUMBAR SPINE - COMPLETE 4+ VIEW

[l-spine ap]
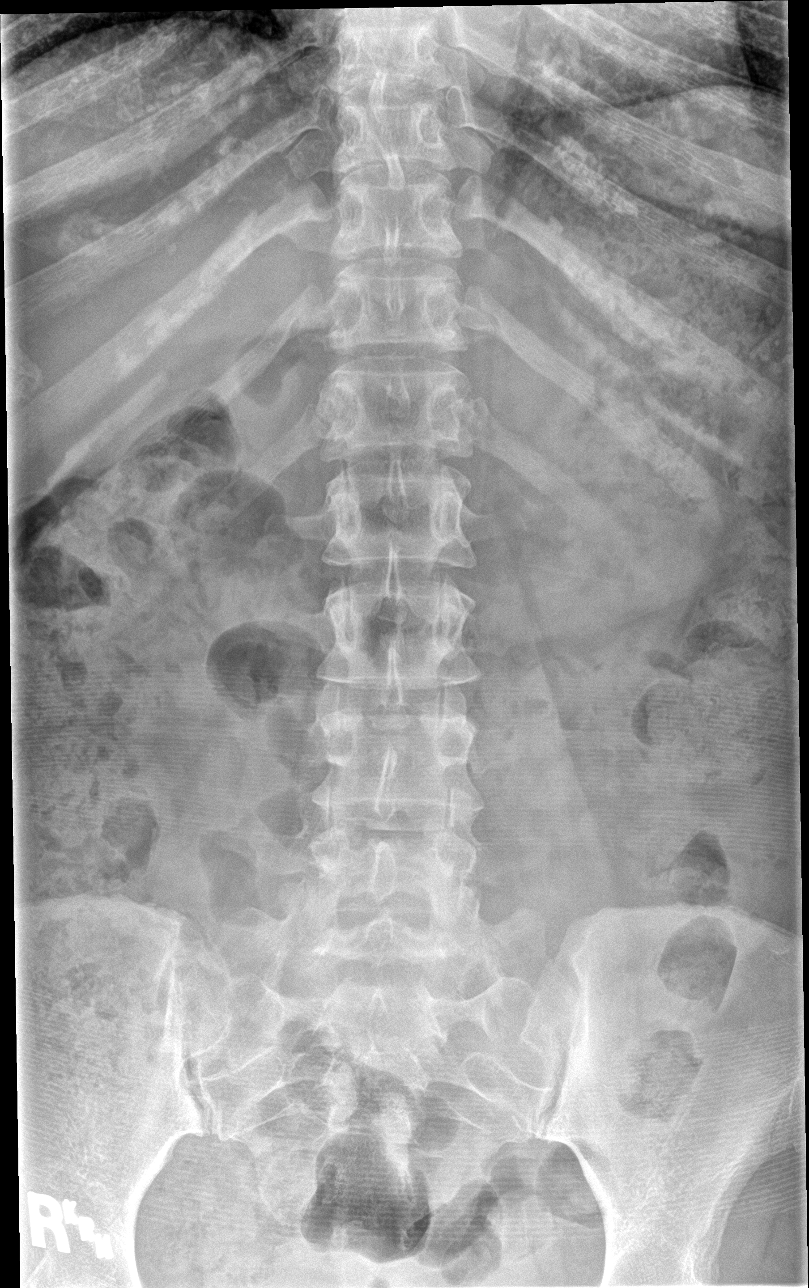

[l-spine obl (1 of 2)]
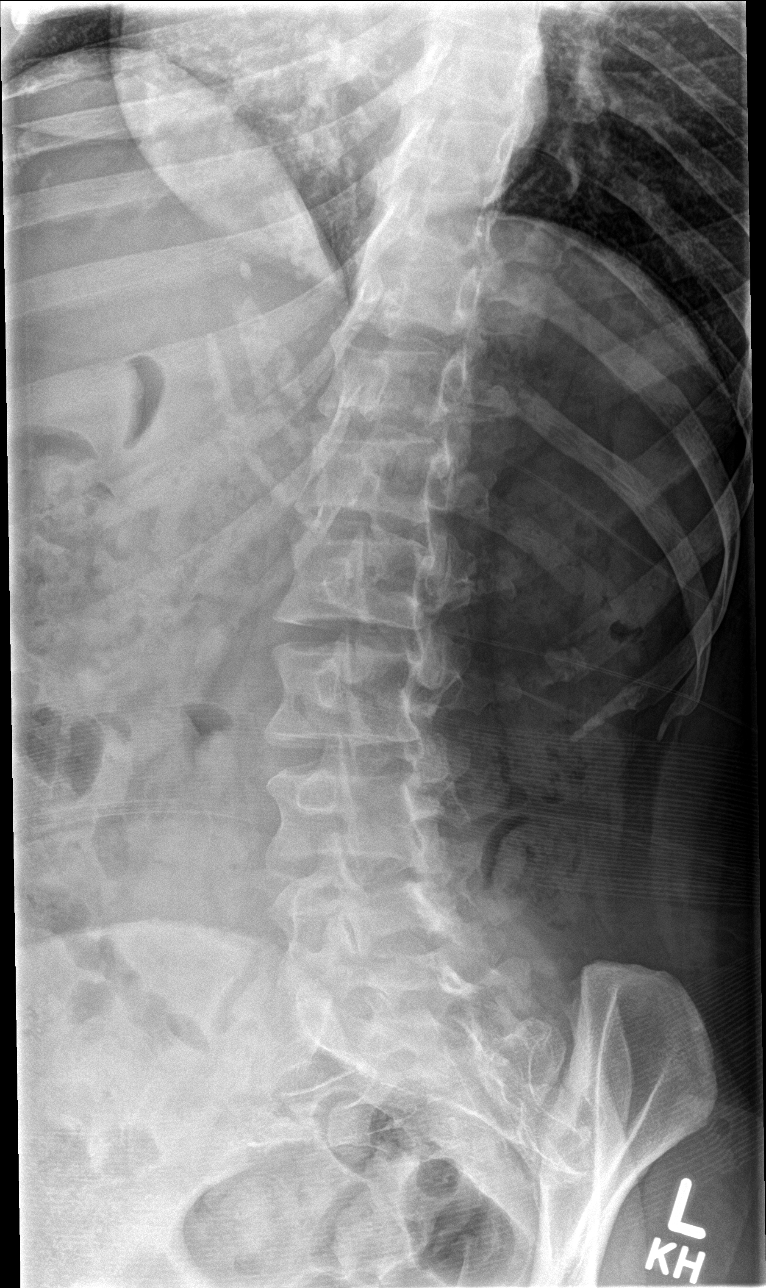

[l-spine obl (2 of 2)]
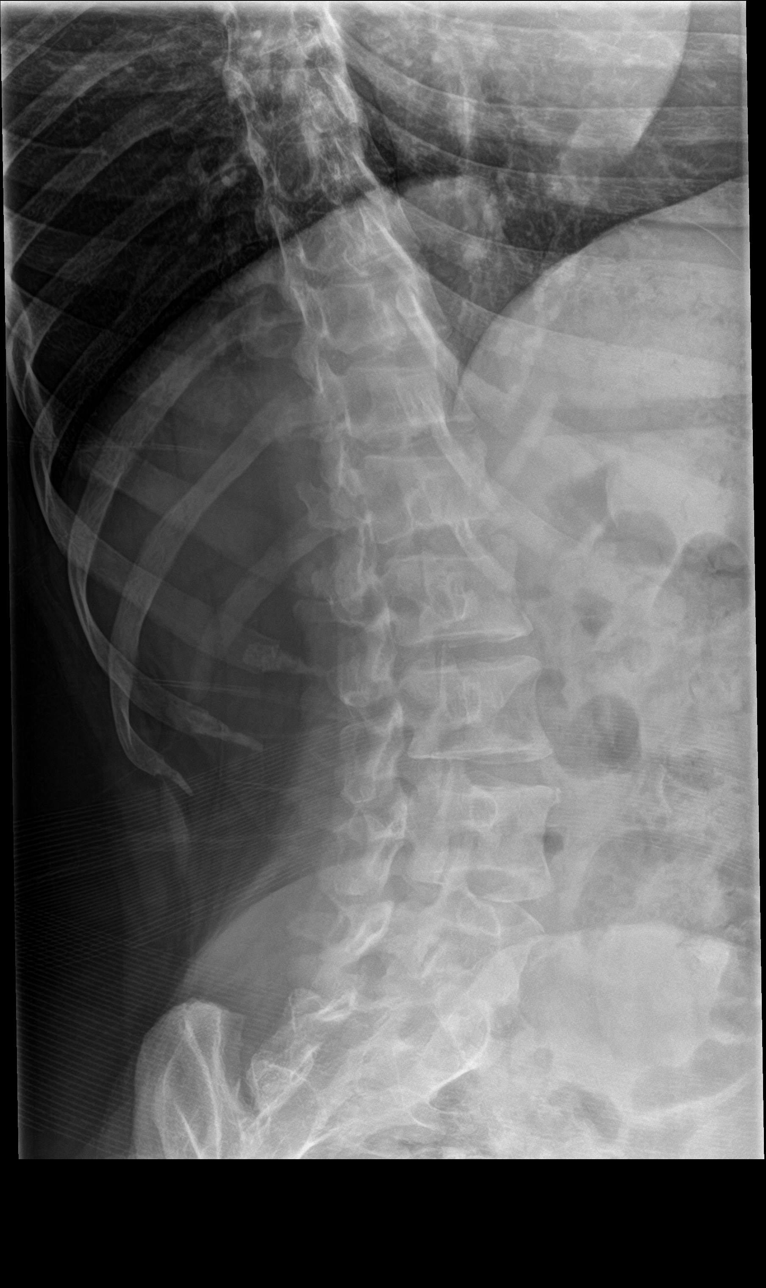

[l-spine lat]
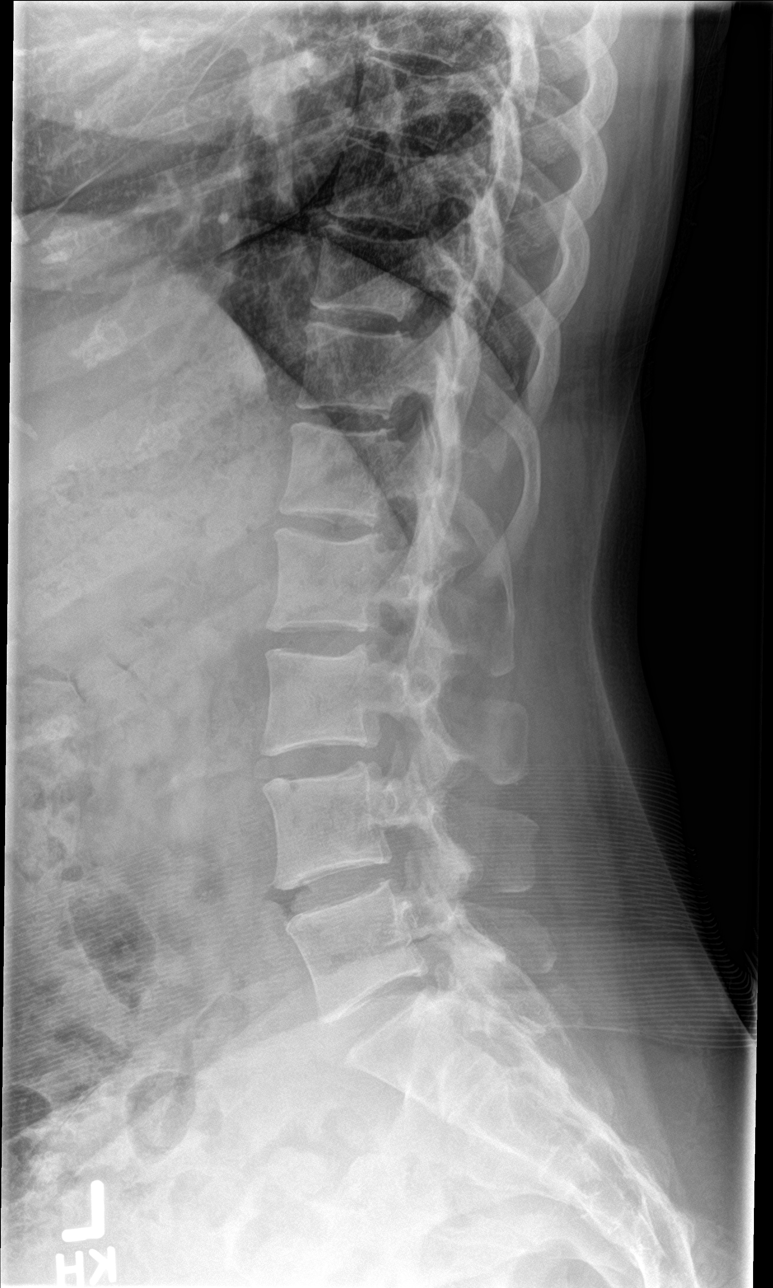

[l-spine spot]
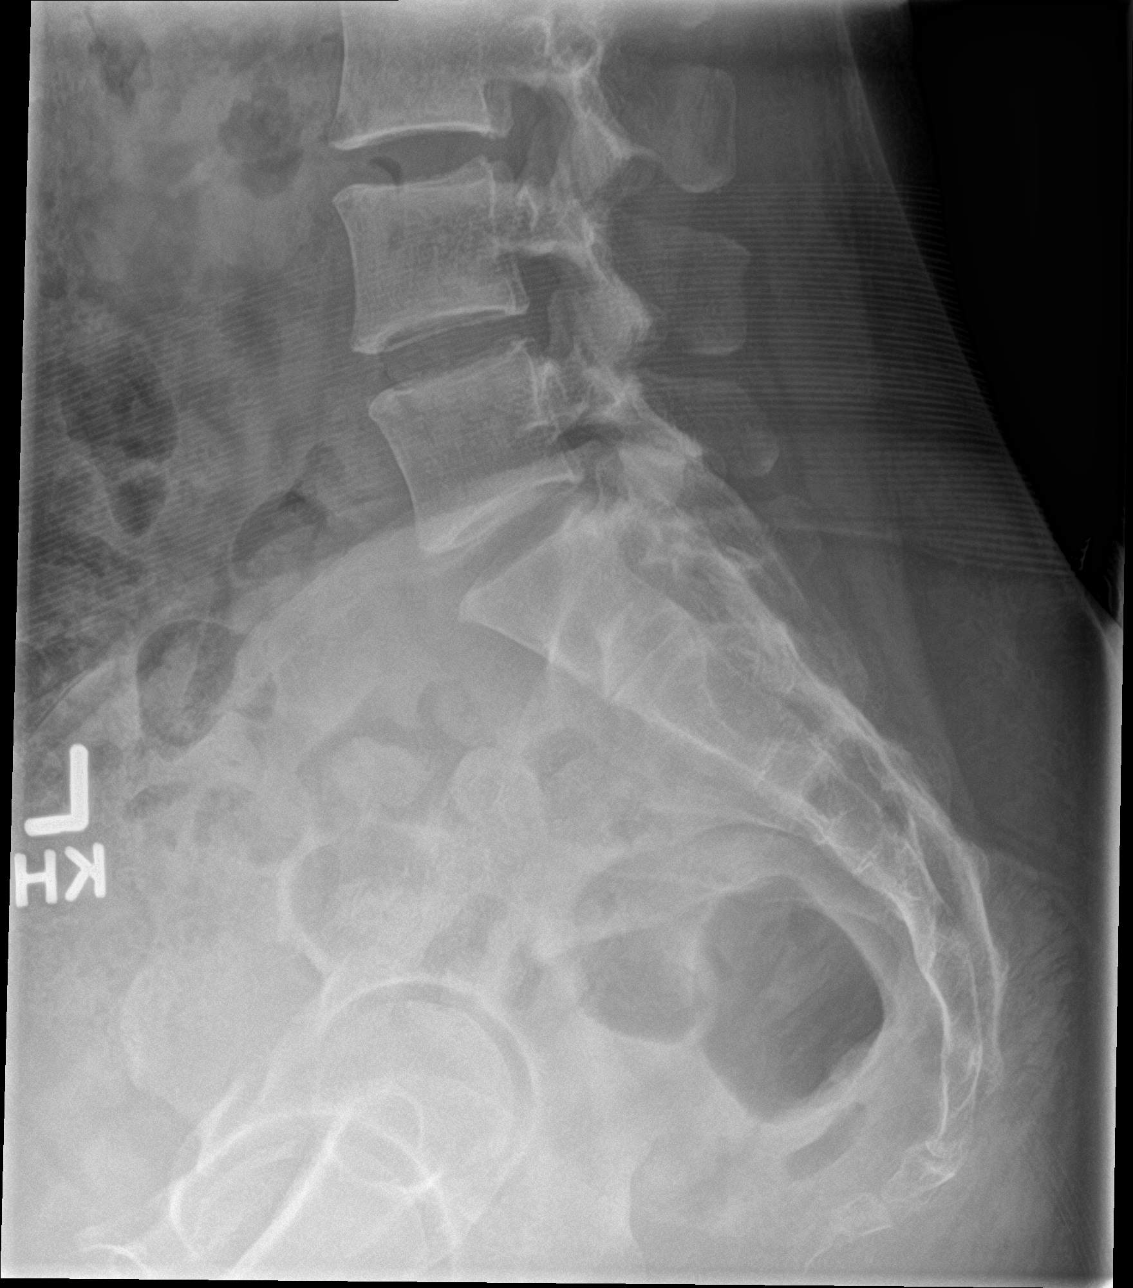

[5 of 5 positions shown; findings below may reference images not displayed]

FINDINGS: There are 4 non rib-bearing vertebral bodies. Alignment is normal.
No disc space narrowing. No evidence of facet arthropathy or pars
defect.
IMPRESSION: No degenerative or traumatic finding. No abnormality seen to explain
pain.

## 2020-01-09 ENCOUNTER — Encounter (HOSPITAL_BASED_OUTPATIENT_CLINIC_OR_DEPARTMENT_OTHER): Payer: Self-pay | Admitting: *Deleted

## 2020-01-09 ENCOUNTER — Emergency Department (HOSPITAL_BASED_OUTPATIENT_CLINIC_OR_DEPARTMENT_OTHER): Payer: Medicaid Other

## 2020-01-09 ENCOUNTER — Other Ambulatory Visit: Payer: Self-pay

## 2020-01-09 ENCOUNTER — Emergency Department (HOSPITAL_BASED_OUTPATIENT_CLINIC_OR_DEPARTMENT_OTHER)
Admission: EM | Admit: 2020-01-09 | Discharge: 2020-01-09 | Disposition: A | Discharge status: Home / Self Care | Payer: Medicaid Other | Attending: Emergency Medicine | Admitting: Emergency Medicine

## 2020-01-09 DIAGNOSIS — M25511 Pain in right shoulder: Secondary | ICD-10-CM | POA: Diagnosis present

## 2020-01-09 MED ORDER — CYCLOBENZAPRINE HCL 5 MG PO TABS
5.0000 mg | ORAL_TABLET | Freq: Once | ORAL | Status: AC
  Administered 2020-01-09: 5 mg via ORAL
  Filled 2020-01-09: qty 1

## 2020-01-09 MED ORDER — CYCLOBENZAPRINE HCL 10 MG PO TABS: 10.0000 mg | ORAL_TABLET | Freq: Two times a day (BID) | ORAL | 0 refills | Status: AC | PRN

## 2020-01-09 MED ORDER — NAPROXEN 500 MG PO TABS: 500.0000 mg | ORAL_TABLET | Freq: Two times a day (BID) | ORAL | 0 refills | Status: AC

## 2020-01-09 MED ORDER — KETOROLAC TROMETHAMINE 60 MG/2ML IM SOLN
60.0000 mg | Freq: Once | INTRAMUSCULAR | Status: AC
  Administered 2020-01-09: 60 mg via INTRAMUSCULAR
  Filled 2020-01-09: qty 2

## 2020-01-09 NOTE — ED Provider Notes (Signed)
[facility] EMERGENCY DEPARTMENT Provider Note   CSN: [id number] Arrival date & time: 01/09/20  1553     History Chief Complaint  Patient presents with  . Shoulder Pain    Cassidy Torres is a 39 y.o. female.  Patient is a 39 year old female with no significant past medical history presenting to the emergency department for right shoulder pain.  She is left-handed.  She reports that the shoulder pain has been progressively getting worse over the past 2 weeks.  She reports that initially started when she was working out doing overhead presses at [employer]  She has also been playing with her grandchildren a lot.  She reports some tingling occasionally down into the arm.  Reports that the pain is worse when she tries to raise her shoulder above her head.  Denies any fever, chills.  Has not tried anything for relief.        History reviewed. No pertinent past medical history.  There are no problems to display for this patient.   History reviewed. No pertinent surgical history.   OB History   No obstetric history on file.     No family history on file.  Social History   Tobacco Use  . Smoking status: Never Smoker  . Smokeless tobacco: Never Used  Substance Use Topics  . Alcohol use: No  . Drug use: No    Home Medications Prior to Admission medications   Medication Sig Start Date End Date Taking? Authorizing Provider  benzocaine (BENZOCAINE ORAL ANESTHETIC) 20 % SOLN Swish, gargle, and spit twice daily as needed for sore throat 03/15/16   Sam, Ace Gins, PA-C  cyclobenzaprine (FLEXERIL) 10 MG tablet Take 1 tablet (10 mg total) by mouth 2 (two) times daily as needed for up to 7 days for muscle spasms. 01/09/20 01/16/20  Arlyn Dunning, PA-C  HYDROcodone-acetaminophen (NORCO) 5-325 MG tablet Take 1-2 tablets by mouth every 4 (four) hours as needed. 07/31/15   Loren Racer, MD  ibuprofen (ADVIL,MOTRIN) 600 MG tablet Take 1 tablet (600 mg total) by mouth every 6  (six) hours as needed. 07/31/15   Loren Racer, MD  ibuprofen (ADVIL,MOTRIN) 600 MG tablet Take 1 tablet (600 mg total) by mouth every 6 (six) hours as needed. 03/15/16   Sam, Ace Gins, PA-C  methocarbamol (ROBAXIN) 500 MG tablet Take 1 tablet (500 mg total) by mouth 2 (two) times daily. 01/09/18   Maxwell Caul, PA-C  metroNIDAZOLE (FLAGYL) 500 MG tablet Take 500 mg by mouth 2 (two) times daily.    [provider]  naproxen (NAPROSYN) 500 MG tablet Take 1 tablet (500 mg total) by mouth 2 (two) times daily. 01/09/20   Arlyn Dunning, PA-C  traMADol (ULTRAM) 50 MG tablet Take 1 tablet (50 mg total) by mouth every 6 (six) hours as needed. 07/08/15   Geoffery Lyons, MD    Allergies    Patient has no known allergies.  Review of Systems   Review of Systems  Constitutional: Negative for chills and fever.  Musculoskeletal: Positive for arthralgias and myalgias. Negative for back pain, neck pain and neck stiffness.  Skin: Negative.   All other systems reviewed and are negative.   Physical Exam Updated Vital Signs BP 118/80 (BP Location: Left Arm)   Pulse 75   Temp 98.9 F (37.2 C) (Oral)   Resp 18   Ht 5' (1.524 m)   Wt 68 kg   SpO2 100%   BMI 29.29 kg/m   Physical  Exam Vitals and nursing note reviewed.  Constitutional:      General: She is not in acute distress.    Appearance: Normal appearance. She is not ill-appearing, toxic-appearing or diaphoretic.  HENT:     Head: Normocephalic.  Eyes:     Conjunctiva/sclera: Conjunctivae normal.  Pulmonary:     Effort: Pulmonary effort is normal.  Musculoskeletal:     Right shoulder: Swelling and tenderness present. No deformity, effusion, laceration, bony tenderness or crepitus. Decreased range of motion. Decreased strength. Normal pulse.  Skin:    General: Skin is warm and dry.     Capillary Refill: Capillary refill takes less than 2 seconds.  Neurological:     Mental Status: She is alert.     Sensory: No sensory deficit.    Psychiatric:        Mood and Affect: Mood normal.     ED Results / Procedures / Treatments   Labs (all labs ordered are listed, but only abnormal results are displayed) Labs Reviewed - No data to display  EKG None  Radiology DG Shoulder Right  Result Date: 01/09/2020 CLINICAL DATA:  Right shoulder pain EXAM: RIGHT SHOULDER - 2+ VIEW COMPARISON:  None. FINDINGS: There is no evidence of fracture or dislocation. There is no evidence of arthropathy or other focal bone abnormality. Soft tissues are unremarkable. IMPRESSION: Negative. Electronically Signed   By: Helyn Numbers MD   On: 01/09/2020 16:33    Procedures Procedures (including critical care time)  Medications Ordered in ED Medications  cyclobenzaprine (FLEXERIL) tablet 5 mg (5 mg Oral Given 01/09/20 1723)  ketorolac (TORADOL) injection 60 mg (60 mg Intramuscular Given 01/09/20 1724)    ED Course  I have reviewed the triage vital signs and the nursing notes.  Pertinent labs & imaging results that were available during my care of the patient were reviewed by me and considered in my medical decision making (see chart for details).  Clinical Course as of Jan 09 1728  Tue Jan 09, 2020  1637 Patient with progressive R shoulder pain since lifting weights. Well appearing on exam. Xray negative. Will start on antiinflammatories and f/u ortho. Advised on return precautions.    [KM]    Clinical Course User Index [KM] Jeral Pinch   MDM Rules/Calculators/A&P                          Based on review of vitals, medical screening exam, lab work and/or imaging, there does not appear to be an acute, emergent etiology for the patient's symptoms. Counseled pt on good return precautions and encouraged both PCP and ED follow-up as needed.  Prior to discharge, I also discussed incidental imaging findings with patient in detail and advised appropriate, recommended follow-up in detail.  Clinical Impression: 1. Acute pain of  right shoulder     Disposition: Discharge  Prior to providing a prescription for a controlled substance, I independently reviewed the patient's recent prescription history on the [place] Controlled Substance Reporting System. The patient had no recent or regular prescriptions and was deemed appropriate for a brief, less than 3 day prescription of narcotic for acute analgesia.  This note was prepared with assistance of Conservation officer, historic buildings. Occasional wrong-word or sound-a-like substitutions may have occurred due to the inherent limitations of voice recognition software.  Final Clinical Impression(s) / ED Diagnoses Final diagnoses:  Acute pain of right shoulder    Rx / DC Orders ED Discharge  Orders         Ordered    naproxen (NAPROSYN) 500 MG tablet  2 times daily     Discontinue  Reprint     01/09/20 1640    cyclobenzaprine (FLEXERIL) 10 MG tablet  2 times daily PRN     Discontinue  Reprint     01/09/20 1640           Arlyn Dunning, PA-C 01/09/20 1729    Charlynne Pander, MD 01/09/20 [phone]

## 2020-01-09 NOTE — Discharge Instructions (Signed)
Your xray was normal. You like have a muscular/tendon problem. We are starting you on anti inflammatories and muscle relaxants. It is important to do gentle, slow range of motion exercises but no heavy lifting. Make an appointment to follow up with orthopedics. Thank you for allowing me to care for you today. Please return to the emergency department if you have new or worsening symptoms. Take your medications as instructed.

## 2020-01-09 NOTE — ED Triage Notes (Signed)
Right shoulder pain x 2 weeks. It started 2 days after going to the gym.

## 2023-01-17 ENCOUNTER — Emergency Department (HOSPITAL_BASED_OUTPATIENT_CLINIC_OR_DEPARTMENT_OTHER)
Admission: EM | Admit: 2023-01-17 | Discharge: 2023-01-17 | Disposition: A | Discharge status: Home / Self Care | Payer: Medicaid Other | Attending: Emergency Medicine | Admitting: Emergency Medicine

## 2023-01-17 ENCOUNTER — Encounter (HOSPITAL_BASED_OUTPATIENT_CLINIC_OR_DEPARTMENT_OTHER): Payer: Self-pay | Admitting: Urology

## 2023-01-17 ENCOUNTER — Emergency Department (HOSPITAL_BASED_OUTPATIENT_CLINIC_OR_DEPARTMENT_OTHER): Payer: Medicaid Other

## 2023-01-17 DIAGNOSIS — W1789XA Other fall from one level to another, initial encounter: Secondary | ICD-10-CM | POA: Diagnosis not present

## 2023-01-17 DIAGNOSIS — S99912A Unspecified injury of left ankle, initial encounter: Secondary | ICD-10-CM | POA: Diagnosis present

## 2023-01-17 DIAGNOSIS — S93492A Sprain of other ligament of left ankle, initial encounter: Secondary | ICD-10-CM | POA: Insufficient documentation

## 2023-01-17 MED ORDER — HYDROCODONE-ACETAMINOPHEN 5-325 MG PO TABS: 1.0000 | ORAL_TABLET | ORAL | 0 refills | Status: AC | PRN

## 2023-01-17 NOTE — ED Provider Notes (Signed)
[place] [facility] Provider Note   CSN: [id number] Arrival date & time: 01/17/23  1406     History  Chief Complaint  Patient presents with   Ankle Injury    Cassidy Torres is a 42 y.o. female without significant past medical history presents to the ED complaining of left ankle pain and swelling after falling from her porch.  She has increased pain with weight bearing, but has been able to walk on it.  She states the swelling started very quickly after the injury which concerned her and prompted her to come to the ED.  Denies numbness or tingling, other injury.         Home Medications Prior to Admission medications   Medication Sig Start Date End Date Taking? Authorizing Provider  HYDROcodone-acetaminophen (NORCO/VICODIN) 5-325 MG tablet Take 1-2 tablets by mouth every 4 (four) hours as needed. 01/17/23  Yes Trenten Watchman R, PA-C  benzocaine (BENZOCAINE ORAL ANESTHETIC) 20 % SOLN Swish, gargle, and spit twice daily as needed for sore throat 03/15/16   Eliseo Squires, PA-C  ibuprofen (ADVIL,MOTRIN) 600 MG tablet Take 1 tablet (600 mg total) by mouth every 6 (six) hours as needed. 07/31/15   Loren Racer, MD  ibuprofen (ADVIL,MOTRIN) 600 MG tablet Take 1 tablet (600 mg total) by mouth every 6 (six) hours as needed. 03/15/16   Eliseo Squires, PA-C  methocarbamol (ROBAXIN) 500 MG tablet Take 1 tablet (500 mg total) by mouth 2 (two) times daily. 01/09/18   Maxwell Caul, PA-C  metroNIDAZOLE (FLAGYL) 500 MG tablet Take 500 mg by mouth 2 (two) times daily.    [provider]  naproxen (NAPROSYN) 500 MG tablet Take 1 tablet (500 mg total) by mouth 2 (two) times daily. 01/09/20   Arlyn Dunning, PA-C      Allergies    Patient has no known allergies.    Review of Systems   Review of Systems  Physical Exam Updated Vital Signs BP (!) 153/98 (BP Location: Left Arm)   Pulse 70   Temp 98.3 F (36.8 C)   Resp 18   Ht 5' (1.524 m)    Wt 68 kg   SpO2 100%   BMI 29.28 kg/m  Physical Exam Vitals and nursing note reviewed.  Constitutional:      General: She is not in acute distress.    Appearance: Normal appearance. She is not ill-appearing or diaphoretic.  Cardiovascular:     Rate and Rhythm: Normal rate and regular rhythm.     Pulses:          Dorsalis pedis pulses are 2+ on the left side.  Pulmonary:     Effort: Pulmonary effort is normal.  Musculoskeletal:     Left ankle: Swelling (lateral soft tissue) present. No deformity or ecchymosis. Tenderness present over the lateral malleolus and ATF ligament. Decreased range of motion. Normal pulse.     Left Achilles Tendon: Normal. No tenderness.  Neurological:     Mental Status: She is alert. Mental status is at baseline.  Psychiatric:        Mood and Affect: Mood normal.        Behavior: Behavior normal.     ED Results / Procedures / Treatments   Labs (all labs ordered are listed, but only abnormal results are displayed) Labs Reviewed - No data to display  EKG None  Radiology DG Ankle Complete Left  Result Date: 01/17/2023 CLINICAL DATA:  Ankle injury EXAM:  LEFT ANKLE COMPLETE - 3+ VIEW COMPARISON:  None Available. FINDINGS: There is lateral soft tissue swelling. There is no acute fracture or dislocation identified. Joint spaces are well maintained. IMPRESSION: Lateral soft tissue swelling. No acute fracture or dislocation identified. Electronically Signed   By: Darliss Cheney M.D.   On: 01/17/2023 15:15    Procedures Procedures    Medications Ordered in ED Medications - No data to display  ED Course/ Medical Decision Making/ A&P                             Medical Decision Making Amount and/or Complexity of Data Reviewed Radiology: ordered.   This patient presents to the ED with chief complaint(s) of left ankle pain and swelling with noncontributory past medical history.  The complaint involves an extensive differential diagnosis and also  carries with it a high risk of complications and morbidity.    The differential diagnosis includes acute fracture, dislocation, contusion, ankle sprain, other ligamentous injury including tear  The initial plan is to obtain x-ray of left ankle  Initial Assessment:   Exam significant for swelling and tenderness to the left ankle, especially over the lateral malleolus and ATFL.  Patient does have decreased ROM due to swelling and pain.  No obvious deformity, ecchymosis.  DP pulse is 2+.  Achilles tendon unremarkable.  Left foot is neurovascularly intact.  Independent visualization and interpretation of imaging: I independently visualized the following imaging with scope of interpretation limited to determining acute life threatening conditions related to emergency care: Left ankle x-ray, which revealed no acute dislocation or fracture.  There is soft tissue swelling, especially lateral.  Treatment and Reassessment: Patient's left ankle was wrapped in an Ace wrap prior to discharge.  Disposition:   Patient evaluated in the ED for left ankle pain and swelling after injury.  X-ray is reassuring and does not show any evidence of dislocation or fracture.  Patient was treated with Ace wrap.  Short course of hydrocodone sent to the pharmacy to help with severe pain.  Discussed taking ibuprofen every 6-8 hours to help with pain and swelling.  Discussed other supportive care measures including RICE.  Provided patient with orthopedic follow-up should her symptoms not improve with conservative treatment.  Work note provided.  The patient has been appropriately medically screened and/or stabilized in the ED. I have low suspicion for any other emergent medical condition which would require further screening, evaluation or treatment in the ED or require inpatient management. At time of discharge the patient is hemodynamically stable and in no acute distress. I have discussed work-up results and diagnosis with  patient and answered all questions. Patient is agreeable with discharge plan. We discussed strict return precautions for returning to the emergency department and they verbalized understanding.            Final Clinical Impression(s) / ED Diagnoses Final diagnoses:  Sprain of anterior talofibular ligament of left ankle, initial encounter    Rx / DC Orders ED Discharge Orders          Ordered    HYDROcodone-acetaminophen (NORCO/VICODIN) 5-325 MG tablet  Every 4 hours PRN        01/17/23 1536              Lenard Simmer, PA-C 01/17/23 1543    Alvira Monday, MD 01/18/23 2233

## 2023-01-17 NOTE — ED Triage Notes (Signed)
Pt states fell from porch and injured left ankle  Swelling and pain with weight bearing

## 2023-01-17 NOTE — Discharge Instructions (Addendum)
Thank you for allowing me to be a part of your care today.  You were evaluated in the ED for left ankle pain and swelling.   Your x-ray did not show evidence of dislocation or broken bones.  You likely have a bad sprain of your ankle.  I recommend taking 600-800 mg of ibuprofen every 6-8 hours for pain and swelling.  I have also sent in a narcotic pain medicine to help with severe pain.  While taking this medication do not consume alcohol, drive, or perform other tasks that require you to be completely alert.   Rest and elevate your leg today and apply ice 2-3 times for no longer than 20 minutes at a time.  You may transition to heat to help with swelling and healing after the first 24 hours.   Follow up with orthopedic provider if you continue to have pain that is not improving despite time and treatment.

## 2023-07-20 ENCOUNTER — Encounter (HOSPITAL_BASED_OUTPATIENT_CLINIC_OR_DEPARTMENT_OTHER): Payer: Self-pay

## 2023-07-20 ENCOUNTER — Other Ambulatory Visit (HOSPITAL_BASED_OUTPATIENT_CLINIC_OR_DEPARTMENT_OTHER): Payer: Self-pay

## 2023-07-20 ENCOUNTER — Emergency Department (HOSPITAL_BASED_OUTPATIENT_CLINIC_OR_DEPARTMENT_OTHER)
Admission: EM | Admit: 2023-07-20 | Discharge: 2023-07-20 | Disposition: A | Discharge status: Home / Self Care | Payer: Medicaid Other | Attending: Emergency Medicine | Admitting: Emergency Medicine

## 2023-07-20 ENCOUNTER — Other Ambulatory Visit: Payer: Self-pay

## 2023-07-20 DIAGNOSIS — Z20822 Contact with and (suspected) exposure to covid-19: Secondary | ICD-10-CM | POA: Diagnosis not present

## 2023-07-20 DIAGNOSIS — J029 Acute pharyngitis, unspecified: Secondary | ICD-10-CM | POA: Diagnosis present

## 2023-07-20 DIAGNOSIS — L539 Erythematous condition, unspecified: Secondary | ICD-10-CM | POA: Diagnosis not present

## 2023-07-20 DIAGNOSIS — R0981 Nasal congestion: Secondary | ICD-10-CM | POA: Insufficient documentation

## 2023-07-20 HISTORY — DX: Essential (primary) hypertension: I10

## 2023-07-20 LAB — RESP PANEL BY RT-PCR (RSV, FLU A&B, COVID)  RVPGX2
Influenza A by PCR: POSITIVE — AB
Influenza B by PCR: NEGATIVE
Resp Syncytial Virus by PCR: NEGATIVE
SARS Coronavirus 2 by RT PCR: NEGATIVE

## 2023-07-20 LAB — GROUP A STREP BY PCR: Group A Strep by PCR: NOT DETECTED

## 2023-07-20 MED ORDER — DEXAMETHASONE 4 MG PO TABS
10.0000 mg | ORAL_TABLET | Freq: Once | ORAL | Status: AC
  Administered 2023-07-20: 10 mg via ORAL
  Filled 2023-07-20: qty 3

## 2023-07-20 MED ORDER — AMOXICILLIN 500 MG PO CAPS
1000.0000 mg | ORAL_CAPSULE | Freq: Every day | ORAL | 0 refills | Status: AC
  Filled 2023-07-20: qty 20, 10d supply, fill #0

## 2023-07-20 NOTE — ED Provider Notes (Signed)
[place] [facility] Provider Note   CSN: [id number] Arrival date & time: 07/20/23  0940     History  Chief Complaint  Patient presents with   Sore Throat    Cassidy Torres is a 43 y.o. female.  Patient here with sore throat, congestion.  Nothing makes it worse or better.  No significant medical history.  Denies any difficulty eating or drinking.  No drooling or difficulty opening mouth.  No dental pain.  No chest pain shortness of breath weakness numbness tingling.  The history is provided by the patient.       Home Medications Prior to Admission medications   Medication Sig Start Date End Date Taking? Authorizing Provider  amoxicillin (AMOXIL) 500 MG capsule Take 2 capsules (1,000 mg total) by mouth daily for 10 days. 07/20/23 07/30/23 Yes Cyndie Woodbeck, DO  ibuprofen (ADVIL,MOTRIN) 600 MG tablet Take 1 tablet (600 mg total) by mouth every 6 (six) hours as needed. 07/31/15  Yes Loren Racer, MD  benzocaine (BENZOCAINE ORAL ANESTHETIC) 20 % SOLN Swish, gargle, and spit twice daily as needed for sore throat 03/15/16   Eliseo Squires, PA-C  HYDROcodone-acetaminophen (NORCO/VICODIN) 5-325 MG tablet Take 1-2 tablets by mouth every 4 (four) hours as needed. 01/17/23   Clark, Meghan R, PA-C  ibuprofen (ADVIL,MOTRIN) 600 MG tablet Take 1 tablet (600 mg total) by mouth every 6 (six) hours as needed. 03/15/16   Eliseo Squires, PA-C  methocarbamol (ROBAXIN) 500 MG tablet Take 1 tablet (500 mg total) by mouth 2 (two) times daily. 01/09/18   Maxwell Caul, PA-C  metroNIDAZOLE (FLAGYL) 500 MG tablet Take 500 mg by mouth 2 (two) times daily.    [provider]  naproxen (NAPROSYN) 500 MG tablet Take 1 tablet (500 mg total) by mouth 2 (two) times daily. 01/09/20   Arlyn Dunning, PA-C      Allergies    Patient has no known allergies.    Review of Systems   Review of Systems  Physical Exam Updated Vital Signs BP (!) 167/120 (BP  Location: Left Arm)   Pulse (!) 110   Temp 99.2 F (37.3 C)   Resp 18   Ht 4\' 11"  (1.499 m)   Wt 59 kg   SpO2 100%   BMI 26.26 kg/m  Physical Exam Vitals and nursing note reviewed.  Constitutional:      General: She is not in acute distress.    Appearance: She is well-developed.  HENT:     Head: Normocephalic and atraumatic.     Right Ear: Tympanic membrane normal.     Left Ear: Tympanic membrane normal.     Mouth/Throat:     Mouth: Mucous membranes are moist.     Pharynx: Posterior oropharyngeal erythema present. No pharyngeal swelling, oropharyngeal exudate or uvula swelling.     Tonsils: No tonsillar exudate or tonsillar abscesses.  Eyes:     Conjunctiva/sclera: Conjunctivae normal.  Cardiovascular:     Rate and Rhythm: Normal rate and regular rhythm.     Heart sounds: Normal heart sounds. No murmur heard. Pulmonary:     Effort: Pulmonary effort is normal. No respiratory distress.     Breath sounds: Normal breath sounds.  Abdominal:     Palpations: Abdomen is soft.     Tenderness: There is no abdominal tenderness.  Musculoskeletal:        General: No swelling.     Cervical back: Normal range of motion and neck supple.  Skin:    General: Skin is warm and dry.     Capillary Refill: Capillary refill takes less than 2 seconds.  Neurological:     Mental Status: She is alert.  Psychiatric:        Mood and Affect: Mood normal.     ED Results / Procedures / Treatments   Labs (all labs ordered are listed, but only abnormal results are displayed) Labs Reviewed  GROUP A STREP BY PCR  RESP PANEL BY RT-PCR (RSV, FLU A&B, COVID)  [id number]    EKG None  Radiology No results found.  Procedures Procedures    Medications Ordered in ED Medications  dexamethasone (DECADRON) tablet 10 mg (10 mg Oral Given 07/20/23 1009)    ED Course/ Medical Decision Making/ A&P                                 Medical Decision Making Risk Prescription drug management.   Cassidy Torres is here with sore throat.  Unremarkable vitals.  Well-appearing.  No trismus or drooling.  No submandibular swelling.  Little bit of erythema to the posterior oropharynx but no obvious abscess.  Normal voice.  Overall we will get strep and flu COVID swab we will treat with antibiotics and Decadron.  She will follow-up her test results on her MyChart.  I have no concern for deep space infectious process.  She has normal mobility of her neck no trismus or drooling.  Some erythema to the throat.  Patient given Decadron.  Will do amoxicillin.  She understands return precautions.  Discharged in good condition.  This chart was dictated using voice recognition software.  Despite best efforts to proofread,  errors can occur which can change the documentation meaning.         Final Clinical Impression(s) / ED Diagnoses Final diagnoses:  Sore throat    Rx / DC Orders ED Discharge Orders          Ordered    amoxicillin (AMOXIL) 500 MG capsule  Daily        07/20/23 1003              [place], Madelaine Bhat, DO 07/20/23 1035

## 2023-07-20 NOTE — ED Triage Notes (Signed)
Started having a sore throat on Saturday. States that she is having back pain and headache. Thinks that she got sick from her child. Has had body ache and coughing. States sinus pressure.

## 2023-07-20 NOTE — Discharge Instructions (Addendum)
I am treated you for strep throat.  Please return if symptoms worsen.  Continue using Tylenol and ibuprofen for any discomfort.  I have treated you with a long-acting steroid that should also help.

## 2024-04-20 ENCOUNTER — Other Ambulatory Visit (HOSPITAL_BASED_OUTPATIENT_CLINIC_OR_DEPARTMENT_OTHER): Payer: Self-pay
